# Patient Record
Sex: Female | Born: 1935 | Race: White | Hispanic: No | Marital: Married | Smoking: Current every day smoker
Health system: Southern US, Community
[De-identification: ages and names within clinical notes are randomized; demographics above are authoritative.]

## PROBLEM LIST (undated history)

## (undated) DIAGNOSIS — M899 Disorder of bone, unspecified: Secondary | ICD-10-CM

## (undated) DIAGNOSIS — E785 Hyperlipidemia, unspecified: Secondary | ICD-10-CM

## (undated) DIAGNOSIS — N9489 Other specified conditions associated with female genital organs and menstrual cycle: Secondary | ICD-10-CM

## (undated) DIAGNOSIS — M949 Disorder of cartilage, unspecified: Secondary | ICD-10-CM

## (undated) DIAGNOSIS — F411 Generalized anxiety disorder: Secondary | ICD-10-CM

## (undated) DIAGNOSIS — Z0000 Encounter for general adult medical examination without abnormal findings: Secondary | ICD-10-CM

## (undated) DIAGNOSIS — I10 Essential (primary) hypertension: Secondary | ICD-10-CM

## (undated) DIAGNOSIS — M545 Low back pain: Secondary | ICD-10-CM

## (undated) DIAGNOSIS — M79609 Pain in unspecified limb: Secondary | ICD-10-CM

## (undated) DIAGNOSIS — M7660 Achilles tendinitis, unspecified leg: Secondary | ICD-10-CM

## (undated) DIAGNOSIS — R21 Rash and other nonspecific skin eruption: Secondary | ICD-10-CM

## (undated) DIAGNOSIS — R05 Cough: Secondary | ICD-10-CM

## (undated) DIAGNOSIS — IMO0002 Reserved for concepts with insufficient information to code with codable children: Secondary | ICD-10-CM

## (undated) DIAGNOSIS — J309 Allergic rhinitis, unspecified: Secondary | ICD-10-CM

## (undated) DIAGNOSIS — R7302 Impaired glucose tolerance (oral): Secondary | ICD-10-CM

## (undated) DIAGNOSIS — F329 Major depressive disorder, single episode, unspecified: Secondary | ICD-10-CM

## (undated) HISTORY — DX: Major depressive disorder, single episode, unspecified: F32.9

## (undated) HISTORY — PX: RECTOCELE REPAIR: SHX761

## (undated) HISTORY — PX: CHOLECYSTECTOMY: SHX55

## (undated) HISTORY — DX: Achilles tendinitis, unspecified leg: M76.60

## (undated) HISTORY — DX: Generalized anxiety disorder: F41.1

## (undated) HISTORY — DX: Disorder of cartilage, unspecified: M94.9

## (undated) HISTORY — DX: Reserved for concepts with insufficient information to code with codable children: IMO0002

## (undated) HISTORY — DX: Allergic rhinitis, unspecified: J30.9

## (undated) HISTORY — DX: Disorder of bone, unspecified: M89.9

## (undated) HISTORY — DX: Essential (primary) hypertension: I10

## (undated) HISTORY — DX: Cough: R05

## (undated) HISTORY — PX: ABDOMINAL HYSTERECTOMY: SHX81

## (undated) HISTORY — DX: Other specified conditions associated with female genital organs and menstrual cycle: N94.89

## (undated) HISTORY — DX: Impaired glucose tolerance (oral): R73.02

## (undated) HISTORY — PX: OTHER SURGICAL HISTORY: SHX169

## (undated) HISTORY — DX: Encounter for general adult medical examination without abnormal findings: Z00.00

## (undated) HISTORY — PX: BLADDER SUSPENSION: SHX72

## (undated) HISTORY — DX: Hyperlipidemia, unspecified: E78.5

## (undated) HISTORY — DX: Pain in unspecified limb: M79.609

## (undated) HISTORY — DX: Rash and other nonspecific skin eruption: R21

## (undated) HISTORY — DX: Low back pain: M54.5

---

## 2004-01-04 ENCOUNTER — Ambulatory Visit: Payer: Self-pay | Admitting: Internal Medicine

## 2004-01-07 ENCOUNTER — Ambulatory Visit: Payer: Self-pay | Admitting: Internal Medicine

## 2004-02-21 ENCOUNTER — Ambulatory Visit: Payer: Self-pay | Admitting: Internal Medicine

## 2004-05-22 ENCOUNTER — Ambulatory Visit: Payer: Self-pay | Admitting: Internal Medicine

## 2005-08-25 ENCOUNTER — Ambulatory Visit: Payer: Self-pay | Admitting: Internal Medicine

## 2006-03-03 ENCOUNTER — Ambulatory Visit: Payer: Self-pay | Admitting: Internal Medicine

## 2006-09-29 ENCOUNTER — Ambulatory Visit: Payer: Self-pay | Admitting: Internal Medicine

## 2006-10-04 ENCOUNTER — Ambulatory Visit: Payer: Self-pay | Admitting: Family Medicine

## 2006-10-05 ENCOUNTER — Ambulatory Visit: Payer: Self-pay | Admitting: Internal Medicine

## 2006-10-05 LAB — CONVERTED CEMR LAB
ALT: 22 units/L (ref 0–35)
AST: 21 units/L (ref 0–37)
Albumin: 4.3 g/dL (ref 3.5–5.2)
Alkaline Phosphatase: 57 units/L (ref 39–117)
BUN: 13 mg/dL (ref 6–23)
Basophils Absolute: 0.1 10*3/uL (ref 0.0–0.1)
Basophils Relative: 0.7 % (ref 0.0–1.0)
Bilirubin Urine: NEGATIVE
Bilirubin, Direct: 0.1 mg/dL (ref 0.0–0.3)
CO2: 27 meq/L (ref 19–32)
Calcium: 9.6 mg/dL (ref 8.4–10.5)
Chloride: 108 meq/L (ref 96–112)
Cholesterol: 157 mg/dL (ref 0–200)
Creatinine, Ser: 0.7 mg/dL (ref 0.4–1.2)
Eosinophils Absolute: 0.1 10*3/uL (ref 0.0–0.6)
Eosinophils Relative: 1.3 % (ref 0.0–5.0)
GFR calc Af Amer: 105 mL/min
GFR calc non Af Amer: 87 mL/min
Glucose, Bld: 118 mg/dL — ABNORMAL HIGH (ref 70–99)
HCT: 43 % (ref 36.0–46.0)
HDL: 48.1 mg/dL (ref >39.0)
Hemoglobin: 14.8 g/dL (ref 12.0–15.0)
Hgb A1c MFr Bld: 6 % (ref 4.6–6.0)
Ketones, ur: NEGATIVE mg/dL
LDL Cholesterol: 93 mg/dL (ref 0–99)
Leukocytes, UA: NEGATIVE
Lymphocytes Relative: 36.1 % (ref 12.0–46.0)
MCHC: 34.5 g/dL (ref 30.0–36.0)
MCV: 90.5 fL (ref 78.0–100.0)
Monocytes Absolute: 0.5 10*3/uL (ref 0.2–0.7)
Monocytes Relative: 5.9 % (ref 3.0–11.0)
Neutro Abs: 4.6 10*3/uL (ref 1.4–7.7)
Neutrophils Relative %: 56 % (ref 43.0–77.0)
Nitrite: NEGATIVE
Platelets: 283 10*3/uL (ref 150–400)
Potassium: 4.2 meq/L (ref 3.5–5.1)
RBC: 4.75 M/uL (ref 3.87–5.11)
RDW: 12.8 % (ref 11.5–14.6)
Sodium: 143 meq/L (ref 135–145)
Specific Gravity, Urine: 1.02 (ref 1.000–1.03)
TSH: 2.24 microintl units/mL (ref 0.35–5.50)
Total Bilirubin: 0.8 mg/dL (ref 0.3–1.2)
Total CHOL/HDL Ratio: 3.3
Total Protein, Urine: NEGATIVE mg/dL
Total Protein: 7.2 g/dL (ref 6.0–8.3)
Triglycerides: 80 mg/dL (ref 0–149)
Urine Glucose: NEGATIVE mg/dL
Urobilinogen, UA: 0.2 (ref 0.0–1.0)
VLDL: 16 mg/dL (ref 0–40)
WBC: 8.3 10*3/uL (ref 4.5–10.5)
pH: 6 (ref 5.0–8.0)

## 2006-10-15 DIAGNOSIS — F411 Generalized anxiety disorder: Secondary | ICD-10-CM

## 2006-10-15 DIAGNOSIS — IMO0002 Reserved for concepts with insufficient information to code with codable children: Secondary | ICD-10-CM | POA: Insufficient documentation

## 2006-10-15 DIAGNOSIS — F329 Major depressive disorder, single episode, unspecified: Secondary | ICD-10-CM

## 2006-10-15 DIAGNOSIS — M545 Low back pain, unspecified: Secondary | ICD-10-CM

## 2006-10-15 DIAGNOSIS — I10 Essential (primary) hypertension: Secondary | ICD-10-CM

## 2006-10-15 HISTORY — DX: Generalized anxiety disorder: F41.1

## 2006-10-15 HISTORY — DX: Low back pain, unspecified: M54.50

## 2006-10-15 HISTORY — DX: Essential (primary) hypertension: I10

## 2006-10-15 HISTORY — DX: Major depressive disorder, single episode, unspecified: F32.9

## 2006-10-15 HISTORY — DX: Reserved for concepts with insufficient information to code with codable children: IMO0002

## 2006-10-18 ENCOUNTER — Ambulatory Visit: Payer: Self-pay | Admitting: Internal Medicine

## 2007-04-08 ENCOUNTER — Ambulatory Visit: Payer: Self-pay | Admitting: Internal Medicine

## 2007-04-08 DIAGNOSIS — E785 Hyperlipidemia, unspecified: Secondary | ICD-10-CM

## 2007-04-08 DIAGNOSIS — J309 Allergic rhinitis, unspecified: Secondary | ICD-10-CM | POA: Insufficient documentation

## 2007-04-08 DIAGNOSIS — M899 Disorder of bone, unspecified: Secondary | ICD-10-CM

## 2007-04-08 DIAGNOSIS — M949 Disorder of cartilage, unspecified: Secondary | ICD-10-CM

## 2007-04-08 HISTORY — DX: Hyperlipidemia, unspecified: E78.5

## 2007-04-08 HISTORY — DX: Allergic rhinitis, unspecified: J30.9

## 2007-04-08 HISTORY — DX: Disorder of bone, unspecified: M89.9

## 2007-05-04 ENCOUNTER — Ambulatory Visit: Payer: Self-pay | Admitting: Internal Medicine

## 2007-08-11 ENCOUNTER — Ambulatory Visit: Payer: Self-pay | Admitting: Internal Medicine

## 2007-08-11 DIAGNOSIS — R059 Cough, unspecified: Secondary | ICD-10-CM

## 2007-08-11 DIAGNOSIS — R05 Cough: Secondary | ICD-10-CM

## 2007-08-11 HISTORY — DX: Cough, unspecified: R05.9

## 2007-12-12 ENCOUNTER — Ambulatory Visit: Payer: Self-pay | Admitting: Internal Medicine

## 2007-12-12 LAB — CONVERTED CEMR LAB
ALT: 21 units/L (ref 0–35)
AST: 22 units/L (ref 0–37)
Albumin: 4.4 g/dL (ref 3.5–5.2)
Alkaline Phosphatase: 52 units/L (ref 39–117)
BUN: 13 mg/dL (ref 6–23)
Basophils Absolute: 0.1 10*3/uL (ref 0.0–0.1)
Basophils Relative: 0.7 % (ref 0.0–3.0)
Bilirubin Urine: NEGATIVE
Bilirubin, Direct: 0.2 mg/dL (ref 0.0–0.3)
CO2: 29 meq/L (ref 19–32)
Calcium: 9.6 mg/dL (ref 8.4–10.5)
Chloride: 106 meq/L (ref 96–112)
Cholesterol: 169 mg/dL (ref 0–200)
Creatinine, Ser: 0.8 mg/dL (ref 0.4–1.2)
Crystals: NEGATIVE
Eosinophils Absolute: 0.2 10*3/uL (ref 0.0–0.7)
Eosinophils Relative: 2.2 % (ref 0.0–5.0)
GFR calc Af Amer: 90 mL/min
GFR calc non Af Amer: 74 mL/min
Glucose, Bld: 119 mg/dL — ABNORMAL HIGH (ref 70–99)
HCT: 42.7 % (ref 36.0–46.0)
HDL: 42.6 mg/dL (ref >39.0)
Hemoglobin: 15 g/dL (ref 12.0–15.0)
Hgb A1c MFr Bld: 5.9 % (ref 4.6–6.0)
Ketones, ur: NEGATIVE mg/dL
LDL Cholesterol: 101 mg/dL — ABNORMAL HIGH (ref 0–99)
Lymphocytes Relative: 28.3 % (ref 12.0–46.0)
MCHC: 35 g/dL (ref 30.0–36.0)
MCV: 91.4 fL (ref 78.0–100.0)
Monocytes Absolute: 0.4 10*3/uL (ref 0.1–1.0)
Monocytes Relative: 4.8 % (ref 3.0–12.0)
Neutro Abs: 4.7 10*3/uL (ref 1.4–7.7)
Neutrophils Relative %: 64 % (ref 43.0–77.0)
Nitrite: NEGATIVE
Platelets: 218 10*3/uL (ref 150–400)
Potassium: 3.7 meq/L (ref 3.5–5.1)
RBC: 4.68 M/uL (ref 3.87–5.11)
RDW: 12.7 % (ref 11.5–14.6)
Sodium: 143 meq/L (ref 135–145)
Specific Gravity, Urine: 1.015 (ref 1.000–1.03)
TSH: 1.96 microintl units/mL (ref 0.35–5.50)
Total Bilirubin: 0.8 mg/dL (ref 0.3–1.2)
Total CHOL/HDL Ratio: 4
Total Protein, Urine: NEGATIVE mg/dL
Total Protein: 7.6 g/dL (ref 6.0–8.3)
Triglycerides: 128 mg/dL (ref 0–149)
Urine Glucose: NEGATIVE mg/dL
Urobilinogen, UA: 0.2 (ref 0.0–1.0)
VLDL: 26 mg/dL (ref 0–40)
WBC: 7.6 10*3/uL (ref 4.5–10.5)
pH: 6 (ref 5.0–8.0)

## 2007-12-16 ENCOUNTER — Ambulatory Visit: Payer: Self-pay | Admitting: Internal Medicine

## 2007-12-16 DIAGNOSIS — E739 Lactose intolerance, unspecified: Secondary | ICD-10-CM

## 2008-06-06 ENCOUNTER — Ambulatory Visit: Payer: Self-pay | Admitting: Internal Medicine

## 2008-06-06 DIAGNOSIS — M7660 Achilles tendinitis, unspecified leg: Secondary | ICD-10-CM

## 2008-06-06 DIAGNOSIS — M79609 Pain in unspecified limb: Secondary | ICD-10-CM

## 2008-06-06 HISTORY — DX: Pain in unspecified limb: M79.609

## 2008-06-06 HISTORY — DX: Achilles tendinitis, unspecified leg: M76.60

## 2009-01-04 ENCOUNTER — Ambulatory Visit: Payer: Self-pay | Admitting: Internal Medicine

## 2009-01-04 LAB — CONVERTED CEMR LAB: Hgb A1c MFr Bld: 5.9 % (ref 4.6–6.5)

## 2009-01-07 ENCOUNTER — Ambulatory Visit: Payer: Self-pay | Admitting: Internal Medicine

## 2009-01-07 DIAGNOSIS — R21 Rash and other nonspecific skin eruption: Secondary | ICD-10-CM

## 2009-01-07 HISTORY — DX: Rash and other nonspecific skin eruption: R21

## 2009-01-07 LAB — CONVERTED CEMR LAB
ALT: 20 units/L (ref 0–35)
AST: 24 units/L (ref 0–37)
Albumin: 4.5 g/dL (ref 3.5–5.2)
Alkaline Phosphatase: 52 units/L (ref 39–117)
BUN: 12 mg/dL (ref 6–23)
Basophils Absolute: 0 10*3/uL (ref 0.0–0.1)
Basophils Relative: 0.6 % (ref 0.0–3.0)
Bilirubin Urine: NEGATIVE
Bilirubin, Direct: 0.1 mg/dL (ref 0.0–0.3)
CO2: 26 meq/L (ref 19–32)
Calcium: 9.4 mg/dL (ref 8.4–10.5)
Chloride: 106 meq/L (ref 96–112)
Cholesterol: 181 mg/dL (ref 0–200)
Creatinine, Ser: 0.9 mg/dL (ref 0.4–1.2)
Eosinophils Absolute: 0.1 10*3/uL (ref 0.0–0.7)
Eosinophils Relative: 0.9 % (ref 0.0–5.0)
GFR calc non Af Amer: 64.65 mL/min (ref >60)
Glucose, Bld: 117 mg/dL — ABNORMAL HIGH (ref 70–99)
HCT: 43.1 % (ref 36.0–46.0)
HDL: 45.2 mg/dL (ref >39.00)
Hemoglobin: 14.5 g/dL (ref 12.0–15.0)
Ketones, ur: NEGATIVE mg/dL
LDL Cholesterol: 112 mg/dL — ABNORMAL HIGH (ref 0–99)
Lymphocytes Relative: 26.8 % (ref 12.0–46.0)
Lymphs Abs: 2 10*3/uL (ref 0.7–4.0)
MCHC: 33.7 g/dL (ref 30.0–36.0)
MCV: 93.1 fL (ref 78.0–100.0)
Monocytes Absolute: 0.3 10*3/uL (ref 0.1–1.0)
Monocytes Relative: 3.9 % (ref 3.0–12.0)
Neutro Abs: 4.9 10*3/uL (ref 1.4–7.7)
Neutrophils Relative %: 67.8 % (ref 43.0–77.0)
Nitrite: NEGATIVE
Platelets: 248 10*3/uL (ref 150.0–400.0)
Potassium: 3.9 meq/L (ref 3.5–5.1)
RBC: 4.63 M/uL (ref 3.87–5.11)
RDW: 12.7 % (ref 11.5–14.6)
Sodium: 142 meq/L (ref 135–145)
Specific Gravity, Urine: 1.02 (ref 1.000–1.030)
TSH: 1.68 microintl units/mL (ref 0.35–5.50)
Total Bilirubin: 0.9 mg/dL (ref 0.3–1.2)
Total CHOL/HDL Ratio: 4
Total Protein, Urine: NEGATIVE mg/dL
Total Protein: 7.7 g/dL (ref 6.0–8.3)
Triglycerides: 118 mg/dL (ref 0.0–149.0)
Urine Glucose: NEGATIVE mg/dL
Urobilinogen, UA: 0.2 (ref 0.0–1.0)
VLDL: 23.6 mg/dL (ref 0.0–40.0)
WBC: 7.3 10*3/uL (ref 4.5–10.5)
pH: 5.5 (ref 5.0–8.0)

## 2009-01-10 ENCOUNTER — Telehealth: Payer: Self-pay | Admitting: Internal Medicine

## 2009-02-21 ENCOUNTER — Ambulatory Visit: Payer: Self-pay | Admitting: Internal Medicine

## 2009-07-06 ENCOUNTER — Ambulatory Visit: Payer: Self-pay | Admitting: Internal Medicine

## 2009-11-04 ENCOUNTER — Telehealth (INDEPENDENT_AMBULATORY_CARE_PROVIDER_SITE_OTHER): Payer: Self-pay | Admitting: *Deleted

## 2009-11-08 ENCOUNTER — Ambulatory Visit: Payer: Self-pay | Admitting: Internal Medicine

## 2009-11-09 LAB — CONVERTED CEMR LAB
ALT: 16 units/L (ref 0–35)
AST: 20 units/L (ref 0–37)
Albumin: 4.6 g/dL (ref 3.5–5.2)
Alkaline Phosphatase: 59 units/L (ref 39–117)
BUN: 12 mg/dL (ref 6–23)
Basophils Absolute: 0 10*3/uL (ref 0.0–0.1)
Basophils Relative: 0.3 % (ref 0.0–3.0)
Bilirubin Urine: NEGATIVE
Bilirubin, Direct: 0.1 mg/dL (ref 0.0–0.3)
CO2: 29 meq/L (ref 19–32)
Calcium: 9.6 mg/dL (ref 8.4–10.5)
Chloride: 105 meq/L (ref 96–112)
Cholesterol: 209 mg/dL — ABNORMAL HIGH (ref 0–200)
Creatinine, Ser: 0.8 mg/dL (ref 0.4–1.2)
Direct LDL: 150.9 mg/dL
Eosinophils Absolute: 0.1 10*3/uL (ref 0.0–0.7)
Eosinophils Relative: 1 % (ref 0.0–5.0)
GFR calc non Af Amer: 74.98 mL/min (ref >60)
Glucose, Bld: 95 mg/dL (ref 70–99)
HCT: 43.3 % (ref 36.0–46.0)
HDL: 41.7 mg/dL (ref >39.00)
Hemoglobin: 14.7 g/dL (ref 12.0–15.0)
Ketones, ur: NEGATIVE mg/dL
Leukocytes, UA: NEGATIVE
Lymphocytes Relative: 26.2 % (ref 12.0–46.0)
Lymphs Abs: 2.3 10*3/uL (ref 0.7–4.0)
MCHC: 34 g/dL (ref 30.0–36.0)
MCV: 92.9 fL (ref 78.0–100.0)
Monocytes Absolute: 0.4 10*3/uL (ref 0.1–1.0)
Monocytes Relative: 4.6 % (ref 3.0–12.0)
Neutro Abs: 6 10*3/uL (ref 1.4–7.7)
Neutrophils Relative %: 67.9 % (ref 43.0–77.0)
Nitrite: NEGATIVE
Platelets: 237 10*3/uL (ref 150.0–400.0)
Potassium: 4.2 meq/L (ref 3.5–5.1)
RBC: 4.66 M/uL (ref 3.87–5.11)
RDW: 14 % (ref 11.5–14.6)
Sodium: 142 meq/L (ref 135–145)
Specific Gravity, Urine: 1.025 (ref 1.000–1.030)
TSH: 1.5 microintl units/mL (ref 0.35–5.50)
Total Bilirubin: 0.8 mg/dL (ref 0.3–1.2)
Total CHOL/HDL Ratio: 5
Total Protein, Urine: NEGATIVE mg/dL
Total Protein: 7.1 g/dL (ref 6.0–8.3)
Triglycerides: 189 mg/dL — ABNORMAL HIGH (ref 0.0–149.0)
Urine Glucose: NEGATIVE mg/dL
Urobilinogen, UA: 0.2 (ref 0.0–1.0)
VLDL: 37.8 mg/dL (ref 0.0–40.0)
WBC: 8.9 10*3/uL (ref 4.5–10.5)
pH: 5.5 (ref 5.0–8.0)

## 2009-11-14 ENCOUNTER — Encounter: Payer: Self-pay | Admitting: Internal Medicine

## 2009-11-14 ENCOUNTER — Ambulatory Visit: Payer: Self-pay | Admitting: Internal Medicine

## 2009-11-14 LAB — CONVERTED CEMR LAB
Cholesterol, target level: 200 mg/dL
HDL goal, serum: 40 mg/dL
LDL Goal: 130 mg/dL

## 2009-11-24 ENCOUNTER — Encounter: Payer: Self-pay | Admitting: Internal Medicine

## 2009-11-25 ENCOUNTER — Ambulatory Visit: Payer: Self-pay | Admitting: Internal Medicine

## 2009-11-25 DIAGNOSIS — N9489 Other specified conditions associated with female genital organs and menstrual cycle: Secondary | ICD-10-CM

## 2009-11-25 HISTORY — DX: Other specified conditions associated with female genital organs and menstrual cycle: N94.89

## 2009-11-27 ENCOUNTER — Encounter: Payer: Self-pay | Admitting: Internal Medicine

## 2009-12-27 ENCOUNTER — Telehealth: Payer: Self-pay | Admitting: Internal Medicine

## 2010-02-26 LAB — BASIC METABOLIC PANEL
BUN: 12 mg/dL (ref 6–23)
CO2: 29 mEq/L (ref 19–32)
Calcium: 9.4 mg/dL (ref 8.4–10.5)
Chloride: 103 mEq/L (ref 96–112)
Creatinine, Ser: 0.73 mg/dL (ref 0.4–1.2)
GFR calc Af Amer: >60 mL/min (ref >60)
GFR calc non Af Amer: >60 mL/min (ref >60)
Glucose, Bld: 101 mg/dL — ABNORMAL HIGH (ref 70–99)
Potassium: 3.4 mEq/L — ABNORMAL LOW (ref 3.5–5.1)
Sodium: 140 mEq/L (ref 135–145)

## 2010-02-26 LAB — CBC
HCT: 44.2 % (ref 36.0–46.0)
Hemoglobin: 15.1 g/dL — ABNORMAL HIGH (ref 12.0–15.0)
MCH: 30.9 pg (ref 26.0–34.0)
MCHC: 34.2 g/dL (ref 30.0–36.0)
MCV: 90.6 fL (ref 78.0–100.0)
Platelets: 254 10*3/uL (ref 150–400)
RBC: 4.88 MIL/uL (ref 3.87–5.11)
RDW: 13.6 % (ref 11.5–15.5)
WBC: 10 10*3/uL (ref 4.0–10.5)

## 2010-02-26 LAB — APTT: aPTT: 32 seconds (ref 24–37)

## 2010-02-26 LAB — PROTIME-INR
INR: 0.94 (ref 0.00–1.49)
Prothrombin Time: 12.8 seconds (ref 11.6–15.2)

## 2010-03-04 ENCOUNTER — Inpatient Hospital Stay (HOSPITAL_COMMUNITY)
Admission: RE | Admit: 2010-03-04 | Discharge: 2010-03-08 | Payer: Self-pay | Source: Home / Self Care | Attending: Obstetrics & Gynecology | Admitting: Obstetrics & Gynecology

## 2010-03-10 LAB — URINALYSIS, ROUTINE W REFLEX MICROSCOPIC
Ketones, ur: 15 mg/dL — AB
Leukocytes, UA: NEGATIVE
Nitrite: POSITIVE — AB
Protein, ur: NEGATIVE mg/dL
Specific Gravity, Urine: >1.03 — ABNORMAL HIGH (ref 1.005–1.030)
Urine Glucose, Fasting: NEGATIVE mg/dL
Urobilinogen, UA: 0.2 mg/dL (ref 0.0–1.0)
pH: 5 (ref 5.0–8.0)

## 2010-03-10 LAB — DIFFERENTIAL
Basophils Absolute: 0 10*3/uL (ref 0.0–0.1)
Basophils Relative: 0 % (ref 0–1)
Eosinophils Absolute: 0 10*3/uL (ref 0.0–0.7)
Eosinophils Relative: 0 % (ref 0–5)
Lymphocytes Relative: 4 % — ABNORMAL LOW (ref 12–46)
Lymphs Abs: 0.8 10*3/uL (ref 0.7–4.0)
Monocytes Absolute: 0.6 10*3/uL (ref 0.1–1.0)
Monocytes Relative: 4 % (ref 3–12)
Neutro Abs: 16.4 10*3/uL — ABNORMAL HIGH (ref 1.7–7.7)
Neutrophils Relative %: 92 % — ABNORMAL HIGH (ref 43–77)

## 2010-03-10 LAB — CBC
HCT: 37.7 % (ref 36.0–46.0)
HCT: 40.7 % (ref 36.0–46.0)
HCT: 37.5 % (ref 36.0–46.0)
Hemoglobin: 12.9 g/dL (ref 12.0–15.0)
Hemoglobin: 13.6 g/dL (ref 12.0–15.0)
Hemoglobin: 12.6 g/dL (ref 12.0–15.0)
MCH: 30.4 pg (ref 26.0–34.0)
MCH: 29.4 pg (ref 26.0–34.0)
MCH: 29.8 pg (ref 26.0–34.0)
MCHC: 34.2 g/dL (ref 30.0–36.0)
MCHC: 33.4 g/dL (ref 30.0–36.0)
MCHC: 33.6 g/dL (ref 30.0–36.0)
MCV: 88.9 fL (ref 78.0–100.0)
MCV: 87.9 fL (ref 78.0–100.0)
MCV: 88.7 fL (ref 78.0–100.0)
Platelets: 230 10*3/uL (ref 150–400)
Platelets: 217 10*3/uL (ref 150–400)
Platelets: 207 10*3/uL (ref 150–400)
RBC: 4.24 MIL/uL (ref 3.87–5.11)
RBC: 4.63 MIL/uL (ref 3.87–5.11)
RBC: 4.23 MIL/uL (ref 3.87–5.11)
RDW: 13.2 % (ref 11.5–15.5)
RDW: 13 % (ref 11.5–15.5)
RDW: 13.1 % (ref 11.5–15.5)
WBC: 12.1 10*3/uL — ABNORMAL HIGH (ref 4.0–10.5)
WBC: 17.8 10*3/uL — ABNORMAL HIGH (ref 4.0–10.5)
WBC: 14.1 10*3/uL — ABNORMAL HIGH (ref 4.0–10.5)

## 2010-03-10 LAB — URINE MICROSCOPIC-ADD ON

## 2010-03-14 IMAGING — CR DG CHEST 2V
2 series · 2 of 2 positions shown · non-contrast
Comparison: 08/11/2007

CLINICAL DATA: Acute bronchitis.

CHEST - 2 VIEW

[view not recorded (1 of 2)]
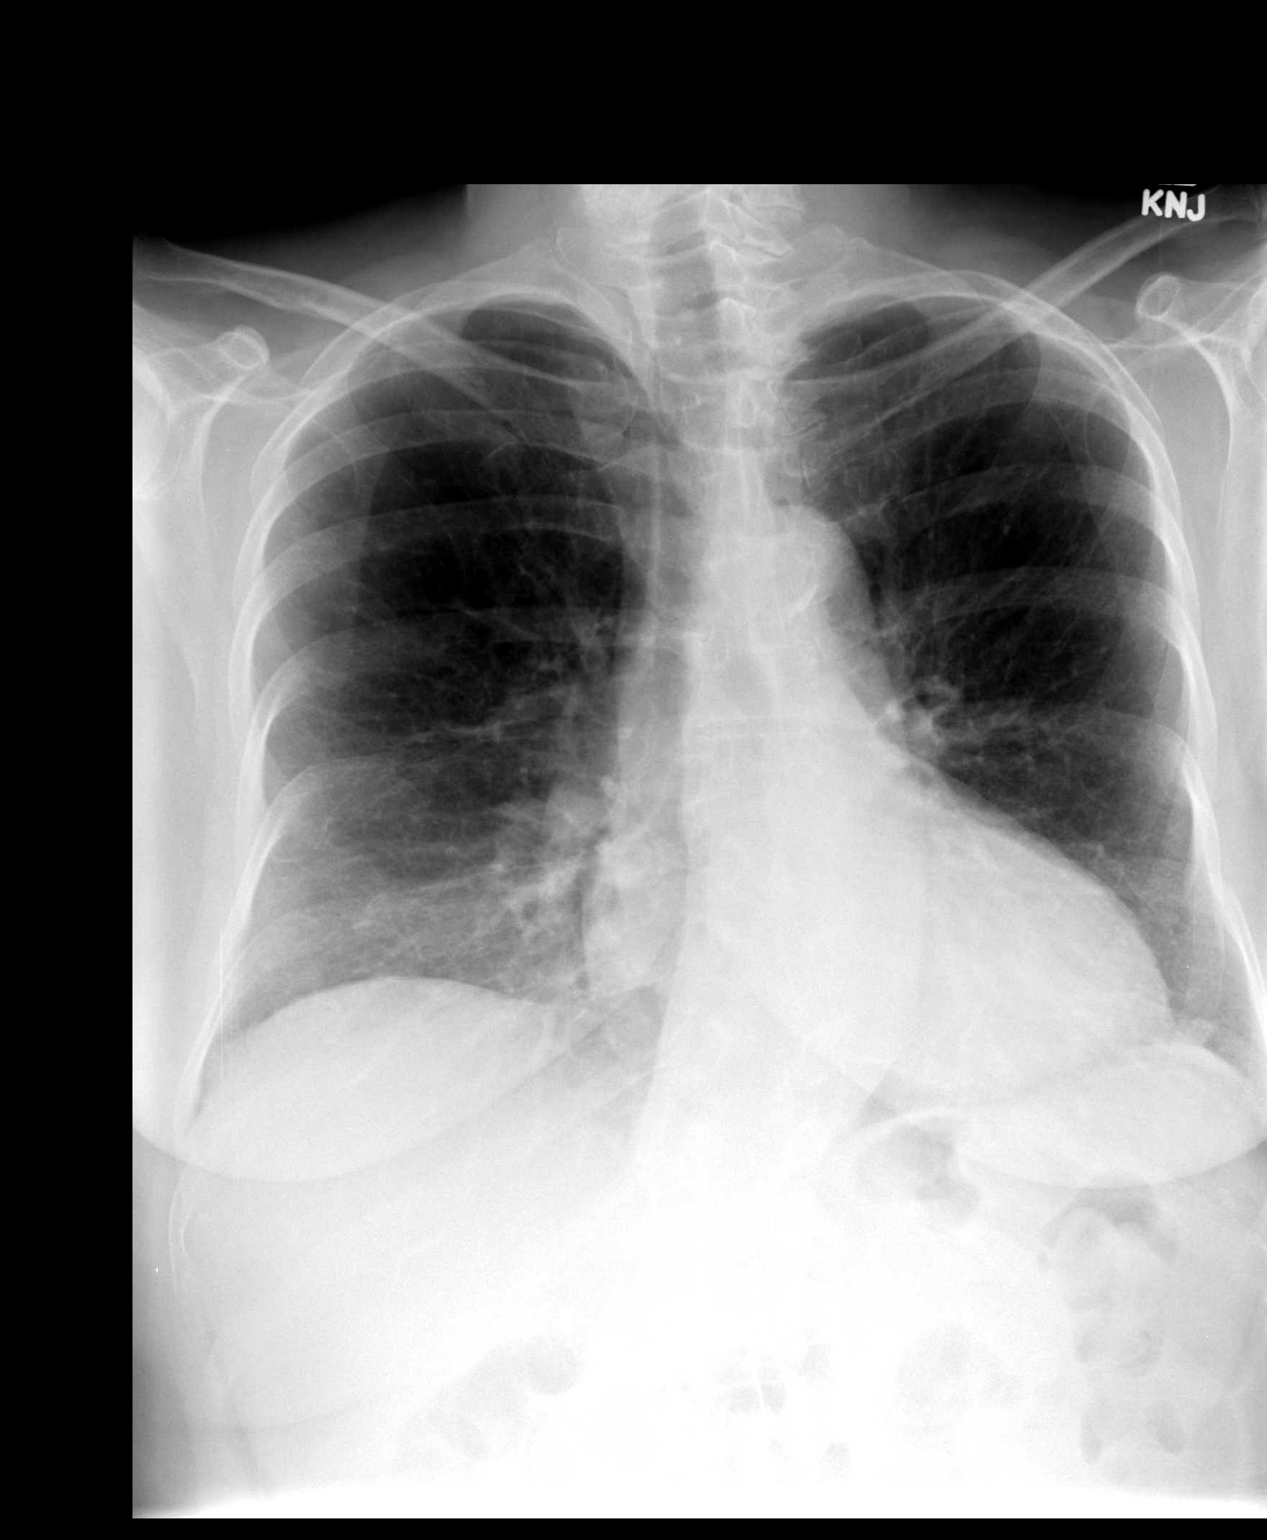

[view not recorded (2 of 2)]
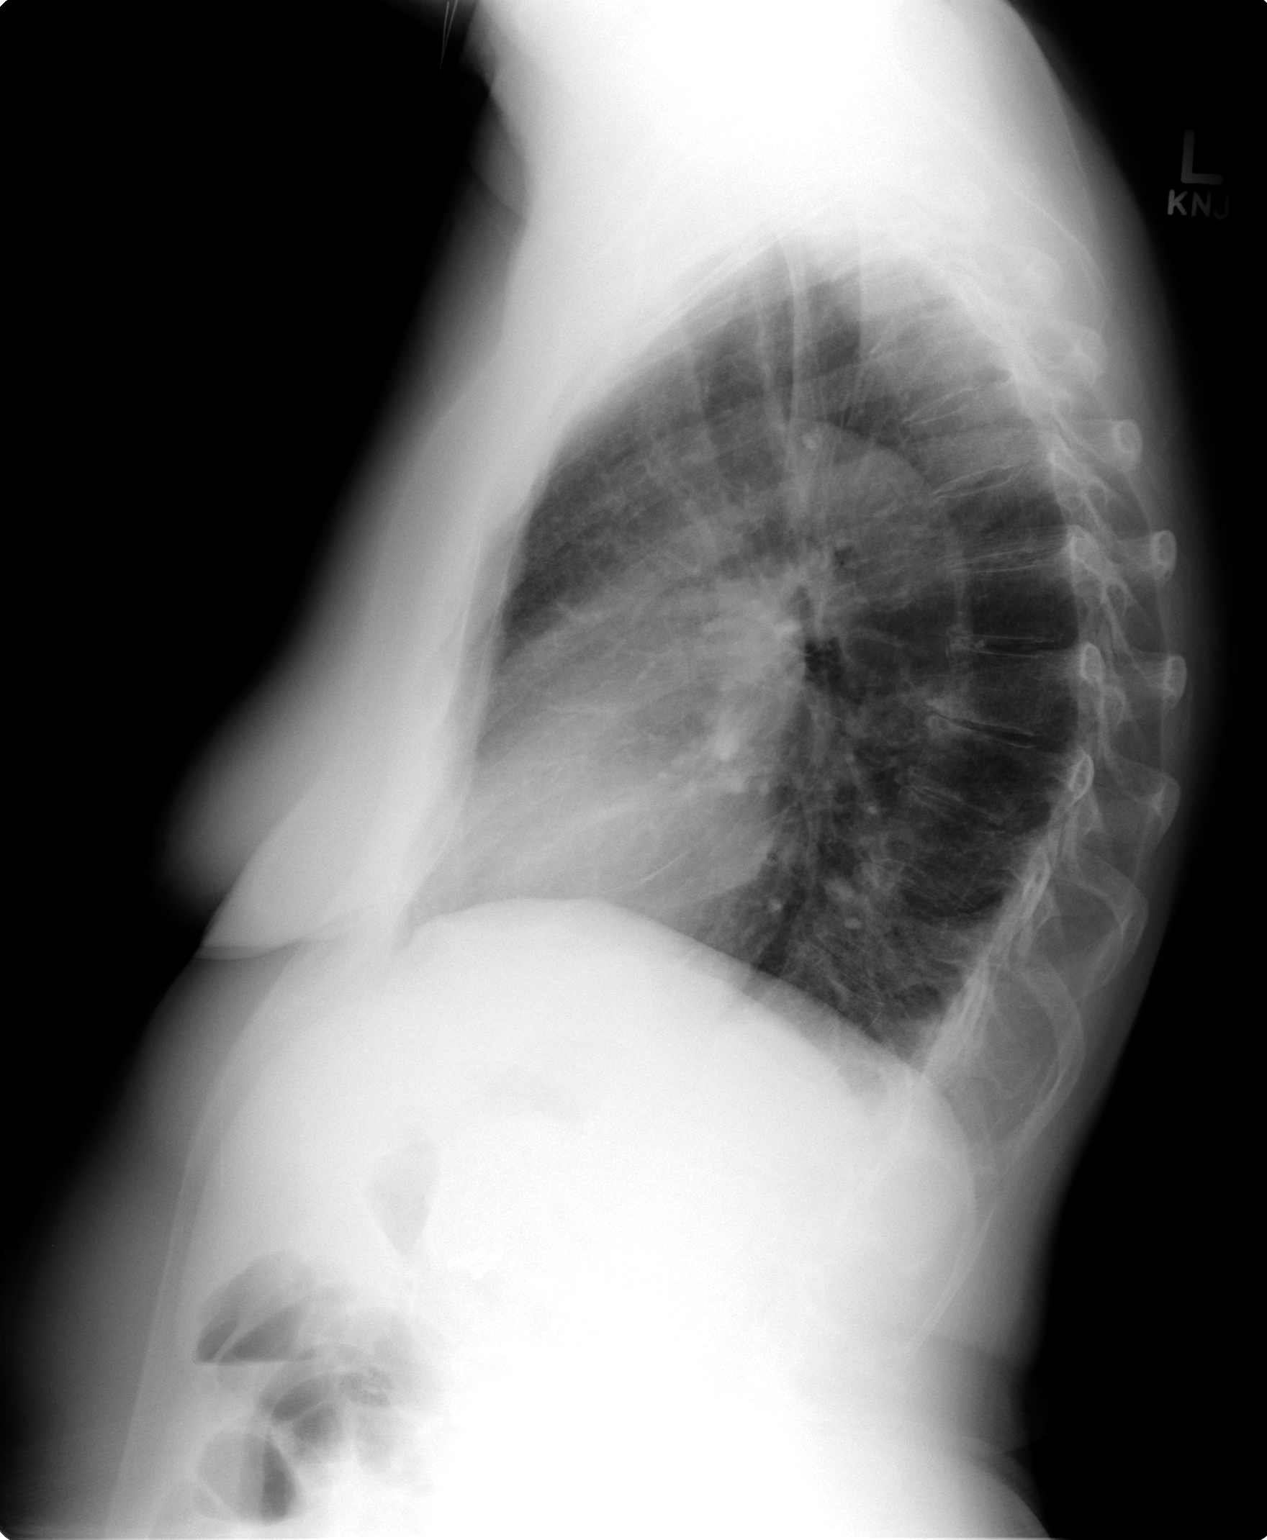

[2 of 2 positions shown; findings below may reference images not displayed]

FINDINGS: Mild pectus excavatum deformity.  This accentuates
borderline cardiomegaly on the frontal view.  Midline trachea.
Calcified transverse aorta. No pleural effusion or pneumothorax.
Clear lungs.

Prior cholecystectomy.
IMPRESSION: 1. No acute cardiopulmonary disease.
2.  Mild pectus excavatum deformity superimposed upon borderline
cardiomegaly.

## 2010-03-20 NOTE — H&P (Addendum)
Brandi Schaefer, Brandi Schaefer NO.:  [id number]  MEDICAL RECORD NO.:  [id number]          PATIENT TYPE:  AMB  LOCATION:  SDC                           FACILITY:  [facility]  PHYSICIAN:  Freddy Finner, M.D.   DATE OF BIRTH:  1930-11-20  DATE OF ADMISSION: DATE OF DISCHARGE:                             HISTORY & PHYSICAL   ADMITTING DIAGNOSES:  Third-degree cystocele, second-degree rectocele, symptomatic.  The patient is a 77 year old white married female, gravida 4, para 4, who was first seen in my office on referral from her regular physician, Dr. Oliver Barre for symptoms of vaginal prolapse.  At that visit, she was confirmed have a third-degree cystocele and second-degree rectocele. This does produce some symptoms of discomfort and pelvic pressure and the patient has now elected to proceed with surgery.  She is now admitted for anterior-posterior vaginal repair, sacrospinous ligament suspension of vagina.  Potential risks of the surgery, including infection, hemorrhage, injury to other organs and other issues, including deep vein thrombosis have been discussed with her, as well as the appropriate time measures as prophylaxis to reduce these issues. She is now prepared to proceed with surgery and is admitted now for that purpose.  REVIEW OF SYSTEMS:  Her current review of systems at the present time is negative.  She has no cardiopulmonary, GI or other GU complaints.  FAMILY HISTORY:  Noncontributory.  PAST MEDICAL HISTORY: 1. The patient is known to have hypertension for which she is     medicated.  Her medication should be listed on the reconciliation     sheet that will be compiled on admission. 2. She also has hypercholesterolemia for which she takes lovastatin. 3. She takes a daily baby aspirin, which she has discontinued for at     least 7 days prior to surgery.  ALLERGIES:  Her known allergies are to CODEINE, SULFA, KEFLEX, NOVOCAINE, EPINEPHRINE AND  SEAFOOD.  PREVIOUS SURGICAL PROCEDURES: 1. Vaginal births on four occasions between 1957-1964. 2. She had her hysterectomy in 1967, which was vaginal. 3. She had gallbladder surgery for gallstones in 1997.  She is a cigarette smoker.  She has never required a blood transfusion.  PHYSICAL EXAMINATION:  HEENT:  Grossly within normal limits. VITAL SIGNS:  Her weight is 125, height 5 feet 7-1/4 inches.  Blood pressure in the office 160/80. GENERAL:  She is alert and oriented in no acute distress. CHEST:  Clear to auscultation. HEART:  Normal sinus rhythm without murmurs, rubs or gallops.  There was an occasional extrasystole, (the patient states recent EKG was normal). ABDOMEN:  Soft and nontender. EXTREMITIES:  Without cyanosis, clubbing or edema. PELVIC:  Findings are remarkable for cystocele, which prolapses through the introitus and a rectocele, which prolapses to the introitus. Bimanual exam reveals no palpable masses.  Rectum and rectovaginal exam confirmed these findings.  ASSESSMENT:  Symptomatic pelvic relaxation.  PLAN:  Anterior-posterior vaginal repair, sacrospinous ligament suspension of vagina, suprapubic cystotomy with a banana catheter.     Freddy Finner, M.D.     WRN/MEDQ  D:  03/04/2010  T:  03/04/2010  Job:  [id number]  Electronically Signed by Lacretia Nicks. Shaleigh Laubscher M.D. on 03/20/2010 08:52:41 AM

## 2010-03-20 NOTE — Discharge Summary (Addendum)
Brandi Schaefer, WIERMAN NO.:  [id number]  MEDICAL RECORD NO.:  [id number]          PATIENT TYPE:  INP  LOCATION:  9305                          FACILITY:  [facility]  PHYSICIAN:  Freddy Finner, M.D.   DATE OF BIRTH:  1930/03/18  DATE OF ADMISSION:  03/04/2010 DATE OF DISCHARGE:  03/08/2010                              DISCHARGE SUMMARY   DISCHARGE DIAGNOSES:  Third-degree cystocele, second-degree rectocele, chronic hypertension and hypercholesterolemia.  OPERATIVE PROCEDURES:  Anterior and posterior vaginal repair, sacrospinous ligament suspension of vagina.  INTRAOPERATIVE COMPLICATIONS:  None.  POSTOPERATIVE COMPLICATIONS:  Urinary tract infection, culture pending, mild postoperative ileus with resolution.  OTHER POSTOPERATIVE COMPLICATIONS:  None.  DISPOSITION:  The patient is in satisfactory and improved condition on the fourth postoperative day.  At this time, she is voiding on her own with voiding volumes of approximately 200 mL and residuals of 50 mL. She is having adequate bowel function.  She is taking a regular diet adequately.  Her condition is considered to be satisfactory.  She is to have progressively increasing physical activity, no vaginal entry, no heavy lifting.  She is to report heavy vaginal bleeding.  She is to report fever.  She is to report urinary retention or to return to the hospital in the event of that problem.  She is to take a regular diet. She is to resume all of her admission medications.  Details of the present illness, past history, family history, review of systems and physical exam are recorded in the admission note.  Her significant physical findings are related to this admission are third- degree rectocele, third-degree cystocele, second-degree rectocele and uterine and vaginal vault descensus.  LABORATORY DATA:  During this admission includes a normal admission prothrombin time and INR normal.  Normal PTT, normal chem  profile and normal CBC.  Postoperative CBC on the first postoperative morning showed a white count of 12.1, hemoglobin of 12.9.  Complete metabolic panel on admission was essentially normal except potassium was 3.4 and glucose 101.  Postoperatively, second and third CBC were obtained.  One on the day prior to discharge, it was found that her white count was 17.1. Urinalysis on that day on a cath specimen showed nitrates and many bacteria.  Repeat WBC on the morning of discharge was 14.1.  HOSPITAL COURSE:  The patient was admitted on the morning of surgery. She was given Unasyn 3 g IV as a preop bolus.  She was placed in PAS hose.  She was brought to the operating room, where the above procedure was accomplished.  Postoperatively, her initial postoperative course was remarkable for profound nausea and vomiting with narcotic pain medication.  She later had a slow return of bowel function and complained of abdominal pain which was thought to be bowel in origin. She responded to Fleet enema and oral laxatives and on the day prior, her discharge had profound diarrhea which was treated with Lomotil.  On the evening of the day prior to discharge, she was started on IV fluids again because of marked consultation of the urine, poor oral intake. With rehydration, she made  significant improvement on the day prior to her discharge for suprapubic catheter was removed and by the time of her discharge, she was voiding adequately with residuals in the 50 mL range.  She was started on Cipro p.o. on the day of discharge and this will be continued at home.  She is to return to the office in 7-10 days for postoperative followup.  She remained afebrile throughout her hospital stay.     Freddy Finner, M.D.     WRN/MEDQ  D:  03/08/2010  T:  03/09/2010  Job:  [id number]  Electronically Signed by Kayela Humphres. Jadyn Barge M.D. on 03/20/2010 08:52:38 AM

## 2010-03-20 NOTE — Op Note (Addendum)
NAMEAIYAH, SCARPELLI NO.:  [id number]  MEDICAL RECORD NO.:  [id number]          PATIENT TYPE:  INP  LOCATION:  9305                          FACILITY:  [facility]  PHYSICIAN:  Freddy Finner, M.D.   DATE OF BIRTH:  19-Sep-1930  DATE OF PROCEDURE:  03/04/2010 DATE OF DISCHARGE:                              OPERATIVE REPORT   PREOPERATIVE DIAGNOSES:  Cystocele with prolapse, rectocele with prolapse to the vaginal opening, vaginal vault descensus.  POSTOPERATIVE DIAGNOSES:  Cystocele with prolapse, rectocele with prolapse to the vaginal opening, vaginal vault descensus.  OPERATIVE PROCEDURES:  Anterior and posterior vaginal repair, sacrospinous ligament suspension of vagina, suprapubic cystotomy with a banana catheter.  SURGEON:  Freddy Finner, MD  ASSISTANT:  __________  ANESTHESIA:  General endotracheal.  ESTIMATED INTRAOPERATIVE BLOOD LOSS:  50 mL.  INTRAOPERATIVE COMPLICATIONS:  None.  DETAILS OF THE PRESENT ILLNESS RECORDED IN THE ADMISSION:  The patient was admitted on the morning of surgery.  She was given a bolus of Unasyn for IV administration preoperatively.  She was placed in PAS hose.  She was brought into the operating room there and placed under adequate general endotracheal anesthesia and placed in dorsal lithotomy position using the Hartly stirrup system.  Hibiclens was used to do the prep because the patient is allergic to [facility].  Sterile drapes were applied.  A posterior weighted vaginal retractor was placed.  Anterior vaginal mucosa was grasped just distal to the apex of the vagina in the midline.  A sharp incision was made with a 15 scalpel.  Edges of the incision were grasped with Allis in progress of extension of the incision was carried out after blunt and sharp dissection to free the mucosa from the bladder and this was extended for a distance of approximately 67 cm and was not extended into the urethrovesical angle because she has  no issues of incontinence.  With careful blunt and sharp dissection, the bladder was dissected free from the mucosa.  Plication sutures of 0 Monocryl were then used to reapproximate the urogenital fascia and support the cystocele.  Segments of mucosa were excised.  The incision was closed with a running 2-0 Monocryl suture.  Attention was then turned posteriorly.  The vaginal mucosa was grasped with a fourchette on either side.  A pyramidal shaped segment of skin was excised from the perineal body with apex inferiorly. Mucosa overlying the rectum was then treated essentially in a identical manner to the anterior dissection.  This freed the rectum from the mucosa.  Using a Capio device, a Dexon suture was attached to the right sacrospinous ligament and attached to the mucosa above the level of the incision. Plication sutures were then used to reapproximate the anorectal tissue. A total of 3 were used and one was used to reapproximate the levators. The mucosal closure was then started with a running 2-0 Monocryl and was closed for a distance of approximately 3-4 cm.  The sac spinous ligament stitch was then tied adequately elevating the vaginal vault into a more anatomic location.  The remaining incision was closed in a fashion similar  to an episiotomy.  The case was so dry, was elected not to place a vaginal pack.  The bladder was filled with saline.  The banana suprapubic catheter was inserted without difficulty and anchored to the skin with silk sutures.  Please note, the bladder was then emptied and the suprapubic device attached to the Foley system.  The patient was then awakened and taken to the recovery room in good condition.     Freddy Finner, M.D.     WRN/MEDQ  D:  03/04/2010  T:  03/05/2010  Job:  [id number]  Electronically Signed by Nylan Nevel. Talor Cheema M.D. on 03/20/2010 08:52:43 AM

## 2010-03-25 NOTE — Miscellaneous (Signed)
Summary: Orders Update  Clinical Lists Changes  Orders: Added new Service order of EKG w/ Interpretation (93000) - Signed 

## 2010-03-25 NOTE — Progress Notes (Signed)
  Phone Note Call from Patient Call back at Home Phone [phone]   Caller: Patient Call For: Corwin Levins MD Summary of Call: Pt requesting labs prior to office visit in September. Please advise what labs are needed prior to visit? Initial call taken by: Verdell Face,  November 04, 2009 10:22 AM  Follow-up for Phone Call        CP[facility] Ua Follow-up by: Margaret Pyle, CMA,  November 04, 2009 10:28 AM

## 2010-03-25 NOTE — Assessment & Plan Note (Signed)
Summary: FOOT PAIN//VGJ   Vital Signs:  Patient profile:   77 year old female Weight:      135 pounds O2 Sat:      94 % on Room air Temp:     98.6 degrees F oral Pulse rate:   100 / minute BP sitting:   130 / 80  (left arm)  Vitals Entered By: Doristine Devoid (Jul 06, 2009 9:59 AM)  O2 Flow:  Room air CC: R foot pain worse at night    History of Present Illness: Started with cramping in left 3rd and 4th toes at times--about 1 month ago would be stiff and then get better then started being awakened with pain at night under those toes would rub it and would eventually improve Better for a few nights then restarted a week ago Pain feels similar to pain from herniated disc   Started taping together 5-6 days ago better for a few days but has restarted again still only at night No restrictions in walking or activity  no swelling  Allergies: 1)  ! Codeine 2)  ! Sulfa 3)  ! Keflex 4)  ! Novocain 5)  ! Epinephrine 6)  ! * Asperatame 7)  ! Biaxin 8)  ! * Pneumovax 9)  ! * Generic Only Lotrel 10/20  Past History:  Past medical, surgical, family and social histories (including risk factors) reviewed for relevance to current acute and chronic problems.  Past Medical History: Reviewed history from 12/16/2007 and no changes required. Herniated Disc Anxiety Depression Hypertension Low back pain skin cancer left arm - melanoma per pt- dr Terri Piedra Allergic rhinitis Osteopenia Hyperlipidemia t-spine DJD glucose intolerance  Past Surgical History: Reviewed history from 06/06/2008 and no changes required. 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 CHF HTN uterine cancer COPD - grandfather stroke DM Family History of Alcoholism/Addiction son with paranoid schizophrenia, CHF grandmother with dementia  Social History: Reviewed  history from 04/08/2007 and no changes required. 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   Msk:  no joint tenderness and no joint swelling.   No tenderness in left ankle, foot or toes Normal ROM   Impression & Recommendations:  Problem # 1:  TOE PAIN (ICD-729.5) Assessment New exam is normal pain sounds nerve related will try pad under toes to give forefoot support stability shoes ?podiatrist if not improving  Complete Medication List: 1)  Lovastatin 40 Mg Tabs (Lovastatin) .Marland Kitchen.. 1 by mouth once daily 2)  Ecotrin Low Strength 81 Mg Tbec (Aspirin) .Marland Kitchen.. 1 by mouth qd 3)  Amlodipine Besy-benazepril Hcl 5-10 Mg Caps (Amlodipine besy-benazepril hcl) .... 2 by mouth once daily 4)  Cetirizine Hcl 10 Mg Tabs (Cetirizine hcl) .Marland Kitchen.. 1 by mouth once daily 5)  Tramadol Hcl 50 Mg Tabs (Tramadol hcl) .Marland Kitchen.. 1 at bedtime as needed for toe pain  Patient Instructions: 1)  Try stability shoes 2)  Put pad under left middle toes then tape during the day 3)  If the pain continues, will need to see podiatrist Prescriptions: TRAMADOL HCL 50 MG TABS (TRAMADOL HCL) 1 at bedtime as needed for toe pain  #30 x 0   Entered and Authorized by:   Cindee Salt MD  Signed by:   Cindee Salt MD on 07/06/2009   Method used:   Electronically to        [facility] [phone][facility] (retail)       [address]       [place], [place]  [address]       Ph: [phone]       Fax: [phone]   RxID:   [id number]

## 2010-03-25 NOTE — Consult Note (Signed)
Summary: [facility]   Imported By: Sherian Rein 12/10/2009 10:13:59  _____________________________________________________________________  External Attachment:    Type:   Image     Comment:   External Document

## 2010-03-25 NOTE — Progress Notes (Signed)
Summary: Rx Change.   Phone Note Call from Patient Call back at Home Phone [phone]   Caller: Patient Call For: Corwin Levins MD Summary of Call: Pt is requesting for Dr. Jonny Ruiz to increase the LID2% to LID5% of KETOPROF. Pt stated that she spoke with Dr. Terri Piedra and he adviced her to have Dr. Jonny Ruiz to increase this med so it would work better. Pt stated that this med has not been working for her. Pt will be out of town and she will be back on 01/01/2010.  Initial call taken by: Livingston Diones,  December 27, 2009 2:11 PM  Follow-up for Phone Call        done hardcopy to LIM side B - dahlia  Follow-up by: Corwin Levins MD,  December 27, 2009 6:07 PM  Additional Follow-up for Phone Call Additional follow up Details #1::        Pt informed, Rx in cabinet for pt pick up Additional Follow-up by: Margaret Pyle, CMA,  December 30, 2009 8:45 AM    New/Updated Medications: * KETOPROF20%/LID5%/GABA5%P use asd at bedtime as needed Prescriptions: KETOPROF20%/LID5%/GABA5%P use asd at bedtime as needed  #60 gm x 1   Entered and Authorized by:   Corwin Levins MD   Signed by:   Corwin Levins MD on 12/27/2009   Method used:   Print then Give to Patient   RxID:   [id number]

## 2010-03-25 NOTE — Assessment & Plan Note (Signed)
Summary: f/u appt/cd   #   Vital Signs:  Patient profile:   77 year old female Height:      60 inches Weight:      125.50 pounds BMI:     24.60 O2 Sat:      92 % on Room air Temp:     98.1 degrees F oral Pulse rate:   79 / minute BP sitting:   128 / 84  (left arm) Cuff size:   regular  Vitals Entered By: Zella Ball Ewing CMA Duncan Dull) (November 14, 2009 3:23 PM)  O2 Flow:  Room air  Preventive Care Screening     decleine dxa for now - maybe next yr; declines mammogram  CC: followup/RE, Lipid Management   CC:  followup/RE and Lipid Management.  History of Present Illness: here for wellness, overall doing well, has ongoing toe pain without erythema or sweling;    only taking the statin 3 times per wk after a letter from [facility] that scared her  going to [facility] twice per wk  lost some wt from last yr intentionally - 13 lbs ;  Pt denies CP, worsening sob, doe, wheezing, orthopnea, pnd, worsening LE edema, palps, dizziness or syncope  Pt denies new neuro symptoms such as headache, facial or extremity weakness  Pt denies polydipsia, polyuria.  Overall good compliance with meds, trying to follow low chol, DM diet, wt stable, little excercise however  No fever, wt loss, night sweats, loss of appetite or other constitutional symptoms   Lipid Management History:      Positive NCEP/ATP III risk factors include female age 46 years old or older, current tobacco user, and hypertension.    Preventive Screening-Counseling & Management      Drug Use:  no.    Problems Prior to Update: 1)  Toe Pain  (ICD-729.5) 2)  Rash-nonvesicular  (ICD-782.1) 3)  Achilles Tendinitis  (ICD-726.71) 4)  Thumb Pain, Right  (ICD-729.5) 5)  Glucose Intolerance  (ICD-271.3) 6)  Preventive Health Care  (ICD-V70.0) 7)  Cough  (ICD-786.2) 8)  Hyperlipidemia  (ICD-272.4) 9)  Osteopenia  (ICD-733.90) 10)  Family History of Alcoholism/addiction  (ICD-V61.41) 11)  Allergic Rhinitis  (ICD-477.9) 12)  Low Back Pain   (ICD-724.2) 13)  Hypertension  (ICD-401.9) 14)  Depression  (ICD-311) 15)  Anxiety  (ICD-300.00) 16)  Herniated Disc  (ICD-722.2)  Medications Prior to Update: 1)  Lovastatin 40 Mg Tabs (Lovastatin) .Marland Kitchen.. 1 By Mouth Once Daily 2)  Ecotrin Low Strength 81 Mg  Tbec (Aspirin) .Marland Kitchen.. 1 By Mouth Qd 3)  Amlodipine Besy-Benazepril Hcl 5-10 Mg Caps (Amlodipine Besy-Benazepril Hcl) .... 2 By Mouth Once Daily 4)  Cetirizine Hcl 10 Mg  Tabs (Cetirizine Hcl) .Marland Kitchen.. 1 By Mouth Once Daily 5)  Tramadol Hcl 50 Mg Tabs (Tramadol Hcl) .Marland Kitchen.. 1 At Bedtime As Needed For Toe Pain  Current Medications (verified): 1)  Lovastatin 40 Mg Tabs (Lovastatin) .Marland Kitchen.. 1 By Mouth Once Daily 2)  Ecotrin Low Strength 81 Mg  Tbec (Aspirin) .Marland Kitchen.. 1 By Mouth Qd 3)  Amlodipine Besy-Benazepril Hcl 5-10 Mg Caps (Amlodipine Besy-Benazepril Hcl) .... 2 By Mouth Once Daily 4)  Cetirizine Hcl 10 Mg  Tabs (Cetirizine Hcl) .Marland Kitchen.. 1 By Mouth Once Daily 5)  Ketoprof20%/lid2%/gaba5%p .... Use Asd At Bedtime As Needed  Allergies (verified): 1)  ! Codeine 2)  ! Sulfa 3)  ! Keflex 4)  ! Novocain 5)  ! Epinephrine 6)  ! * Asperatame 7)  ! Biaxin 8)  ! * Pneumovax  9)  ! 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         all otherwise negative per pt -    Physical Exam  General:  alert and normal appearance.   Head:  normocephalic and atraumatic.   Eyes:  vision grossly intact, pupils equal, and pupils round.   Ears:  R ear normal and L ear normal.   Nose:  no external deformity and no nasal discharge.   Mouth:  no gingival abnormalities and pharynx pink and moist.   Neck:  supple and no masses.   Lungs:  normal respiratory effort and normal breath sounds.   Heart:  normal rate and regular rhythm.   Abdomen:  soft, non-tender, and normal bowel sounds.   Msk:  no joint tenderness and no joint swelling.   No tenderness in left ankle, foot or toes Normal ROM Extremities:  no edema, no erythema  Neurologic:  cranial nerves II-XII intact and strength normal in all extremities.   Skin:  color normal and no rashes.   Psych:  not depressed appearing and slightly anxious.     Impression & Recommendations:  Problem # 1:  Preventive Health Care (ICD-V70.0) Overall doing well, age appropriate education and counseling updated and referral for appropriate preventive services done unless declined, immunizations up to date or declined, diet counseling done if overweight, urged to quit smoking if smokes , most  recent labs reviewed and current ordered if appropriate, ecg reviewed or declined (interpretation per ECG scanned in the EMR if done); information regarding Medicare Prevention requirements given if appropriate; speciality referrals updated as appropriate   Problem # 2:  TOE PAIN (ICD-729.5) per ortho - treat as above, f/u any worsening signs or symptoms   Problem # 3:  HYPERLIPIDEMIA (ICD-272.4)  Her updated medication list for this problem includes:    Lovastatin 40 Mg Tabs (Lovastatin) .Marland Kitchen... 1 by mouth once daily to change back to lovastatin daily  Labs Reviewed: SGOT: 20 (11/08/2009)   SGPT: 16 (11/08/2009)  Lipid Goals: Chol Goal: 200 (11/14/2009)   HDL Goal: 40 (11/14/2009)   LDL Goal: 130 (11/14/2009)   TG Goal: 150 (11/14/2009)  10 Yr Risk Heart Disease: 17 %   HDL:41.70 (11/08/2009), 45.20 (01/04/2009)  LDL:112 (01/04/2009), 101 (12/12/2007)  Chol:209 (11/08/2009), 181 (01/04/2009)  Trig:189.0 (11/08/2009), 118.0 (01/04/2009)  Complete Medication List: 1)  Lovastatin  40 Mg Tabs (Lovastatin) .Marland Kitchen.. 1 by mouth once daily 2)  Ecotrin Low Strength 81 Mg Tbec (Aspirin) .Marland Kitchen.. 1 by mouth qd 3)  Amlodipine Besy-benazepril Hcl 5-10 Mg Caps (Amlodipine besy-benazepril hcl) .... 2 by mouth once daily 4)  Cetirizine Hcl 10 Mg Tabs (Cetirizine hcl) .Marland Kitchen.. 1 by mouth once daily 5)  Ketoprof20%/lid2%/gaba5%p  .... Use asd at bedtime as needed  Lipid Assessment/Plan:      Based on NCEP/ATP III, the patient's risk factor category is "0-1 risk factors".  The patient's lipid goals are as follows: Total cholesterol goal is 200; LDL cholesterol goal is 130; HDL cholesterol goal is 40; Triglyceride goal is 150.     Patient Instructions: 1)  Continue all previous medications as before this visit 2)  Please schedule a follow-up appointment in 1 year or sooner if needed Prescriptions: CETIRIZINE HCL 10 MG  TABS (CETIRIZINE HCL) 1 by mouth once daily  #90 x 3   Entered and Authorized by:   Corwin Levins  MD   Signed by:   Corwin Levins MD on 11/14/2009   Method used:   Print then Give to Patient   RxID:   [id number] AMLODIPINE BESY-BENAZEPRIL HCL 5-10 MG CAPS (AMLODIPINE BESY-BENAZEPRIL HCL) 2 by mouth once daily  #180 x 3   Entered and Authorized by:   Corwin Levins MD   Signed by:   Corwin Levins MD on 11/14/2009   Method used:   Print then Give to Patient   RxID:   [id number] LOVASTATIN 40 MG TABS (LOVASTATIN) 1 by mouth once daily  #90 x 3   Entered and Authorized by:   Corwin Levins MD   Signed by:   Corwin Levins MD on 11/14/2009   Method used:   Print then Give to Patient   RxID:   [id number] KETOPROF20%/LID2%/GABA5%P use asd at bedtime as needed  #60 gm x 1   Entered and Authorized by:   Corwin Levins MD   Signed by:   Corwin Levins MD on 11/14/2009   Method used:   Print then Give to Patient   RxID:   [id number]

## 2010-03-25 NOTE — Assessment & Plan Note (Signed)
Summary: PRESSURE IN VAGINAL AREA-LB   Vital Signs:  Patient profile:   77 year old female Height:      60 inches Weight:      125.50 pounds BMI:     24.60 O2 Sat:      94 % on Room air Temp:     97.4 degrees F oral Pulse rate:   86 / minute BP sitting:   120 / 68  (left arm) Cuff size:   regular  Vitals Entered By: Zella Ball Ewing CMA Duncan Dull) (November 25, 2009 4:28 PM)  O2 Flow:  Room air CC: Pressure in vaginal area/RE   CC:  Pressure in vaginal area/RE.  History of Present Illness: here with a concern, c/o pressure to the vaginal area since last sat (2 days) - sensation such as like tampax in place, no blood , no pain, no fever, and feels like pressure/lump  - only feels when stands , much less feeling  with lying down flat and even sitting. No sexually active recetnly; did observe with mirror herself a smooth pink mucosa in the lump area. Denies pelvic pain, or GYn, or GU symtpoms such as dysuria, urgency, freq or incontinence.  No abd pain, n/v, or blood  .  No prior hx of vaginal "mass" Pt denies CP, worsening sob, doe, wheezing, orthopnea, pnd, worsening LE edema, palps, dizziness or syncope  Pt denies new neuro symptoms such as headache, facial or extremity weakness   No change in recurring LBP, or LE radiation/weak/numb.  No worsening depressive symtpoms or panic.  Problems Prior to Update: 1)  Vaginal Mass  (ICD-625.8) 2)  Toe Pain  (ICD-729.5) 3)  Rash-nonvesicular  (ICD-782.1) 4)  Achilles Tendinitis  (ICD-726.71) 5)  Thumb Pain, Right  (ICD-729.5) 6)  Glucose Intolerance  (ICD-271.3) 7)  Preventive Health Care  (ICD-V70.0) 8)  Cough  (ICD-786.2) 9)  Hyperlipidemia  (ICD-272.4) 10)  Osteopenia  (ICD-733.90) 11)  Family History of Alcoholism/addiction  (ICD-V61.41) 12)  Allergic Rhinitis  (ICD-477.9) 13)  Low Back Pain  (ICD-724.2) 14)  Hypertension  (ICD-401.9) 15)  Depression  (ICD-311) 16)  Anxiety  (ICD-300.00) 17)  Herniated Disc  (ICD-722.2)  Medications Prior  to Update: 1)  Lovastatin 40 Mg Tabs (Lovastatin) .Marland Kitchen.. 1 By Mouth Once Daily 2)  Ecotrin Low Strength 81 Mg  Tbec (Aspirin) .Marland Kitchen.. 1 By Mouth Qd 3)  Amlodipine Besy-Benazepril Hcl 5-10 Mg Caps (Amlodipine Besy-Benazepril Hcl) .... 2 By Mouth Once Daily 4)  Cetirizine Hcl 10 Mg  Tabs (Cetirizine Hcl) .Marland Kitchen.. 1 By Mouth Once Daily 5)  Ketoprof20%/lid2%/gaba5%p .... Use Asd At Bedtime As Needed  Current Medications (verified): 1)  Lovastatin 40 Mg Tabs (Lovastatin) .Marland Kitchen.. 1 By Mouth Once Daily 2)  Ecotrin Low Strength 81 Mg  Tbec (Aspirin) .Marland Kitchen.. 1 By Mouth Qd 3)  Amlodipine Besy-Benazepril Hcl 5-10 Mg Caps (Amlodipine Besy-Benazepril Hcl) .... 2 By Mouth Once Daily 4)  Cetirizine Hcl 10 Mg  Tabs (Cetirizine Hcl) .Marland Kitchen.. 1 By Mouth Once Daily 5)  Ketoprof20%/lid2%/gaba5%p .... Use Asd At Bedtime As Needed  Allergies (verified): 1)  ! Codeine 2)  ! Sulfa 3)  ! Keflex 4)  ! Novocain 5)  ! Epinephrine 6)  ! * Asperatame 7)  ! Biaxin 8)  ! * Pneumovax 9)  ! 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   Head:  normocephalic and atraumatic.   Eyes:  vision grossly intact, pupils equal, and pupils round.   Ears:  R ear normal and L ear normal.   Nose:  no external deformity and no  nasal discharge.   Mouth:  no gingival abnormalities and pharynx pink and moist.   Neck:  supple and no masses.   Lungs:  normal respiratory effort and normal breath sounds.   Heart:  normal rate and regular rhythm.   Genitalia:  deferred per pt Extremities:  no edema, no erythema  Psych:  not depressed appearing and slightly anxious.     Impression & Recommendations:  Problem # 1:  VAGINAL MASS (ICD-625.8)  refer to GYN - dr neal per pt request for further eval  Orders: Gynecologic Referral (Gyn)  Problem # 2:  HYPERTENSION (ICD-401.9)  Her updated medication list for this problem includes:    Amlodipine Besy-benazepril Hcl 5-10 Mg Caps (Amlodipine besy-benazepril hcl) .Marland Kitchen... 2 by mouth once daily  BP today: 120/68 Prior BP: 128/84 (11/14/2009)  Prior 10 Yr Risk Heart Disease: 17 % (11/14/2009)  Labs Reviewed: K+: 4.2 (11/08/2009) Creat: : 0.8 (11/08/2009)   Chol: 209 (11/08/2009)   HDL: 41.70 (11/08/2009)   LDL: 112 (01/04/2009)   TG: 189.0 (11/08/2009) stable overall by hx and exam, ok to continue meds/tx as is   Problem # 3:  LOW BACK PAIN (ICD-724.2)  Her updated medication list for this problem includes:    Ecotrin Low Strength 81 Mg Tbec (Aspirin) .Marland Kitchen... 1 by mouth qd stable overall by hx and exam, ok to continue meds/tx as is   Problem # 4:  ANXIETY (ICD-300.00) stable overall by hx and exam, ok to continue meds/tx as is - situational, does not need further tx at this time  Complete Medication List: 1)  Lovastatin 40 Mg Tabs (Lovastatin) .Marland Kitchen.. 1 by mouth once daily 2)  Ecotrin Low Strength 81 Mg Tbec (Aspirin) .Marland Kitchen.. 1 by mouth qd 3)  Amlodipine Besy-benazepril Hcl 5-10 Mg Caps (Amlodipine besy-benazepril hcl) .... 2 by mouth once daily 4)  Cetirizine Hcl 10 Mg Tabs (Cetirizine hcl) .Marland Kitchen.. 1 by mouth once daily 5)  Ketoprof20%/lid2%/gaba5%p  .... Use asd at bedtime as needed  Patient Instructions: 1)  You will be contacted about the referral(s) to: Dr Jennette Kettle 2)   Continue all previous medications as before this visit  3)  Please schedule a follow-up appointment as needed.

## 2010-04-02 ENCOUNTER — Telehealth: Payer: Self-pay | Admitting: Internal Medicine

## 2010-04-10 NOTE — Progress Notes (Signed)
Summary: RX replacement  Phone Note Refill Request Message from:  Patient on April 02, 2010 9:49 AM  Refills Requested: Medication #1:  CETIRIZINE HCL 10 MG  TABS 1 by mouth once daily   Dosage confirmed as above?Dosage Confirmed   Supply Requested: 6 months Pt states she lost Rx that was given to her at [facility] 10/2009   Method Requested: Electronic Initial call taken by: Margaret Pyle, CMA,  April 02, 2010 9:48 AM    Prescriptions: CETIRIZINE HCL 10 MG  TABS (CETIRIZINE HCL) 1 by mouth once daily  #30 x 6   Entered by:   Margaret Pyle, CMA   Authorized by:   Corwin Levins MD   Signed by:   Margaret Pyle, CMA on 04/02/2010   Method used:   Electronically to        [facility] [phone]* (retail)       [address]       [place], [place]  [address]       Ph: [phone]       Fax: [phone]   RxID:   [id number]

## 2010-09-08 ENCOUNTER — Encounter: Payer: Self-pay | Admitting: *Deleted

## 2010-09-08 ENCOUNTER — Telehealth: Payer: Self-pay | Admitting: *Deleted

## 2010-09-08 DIAGNOSIS — R7302 Impaired glucose tolerance (oral): Secondary | ICD-10-CM

## 2010-09-08 DIAGNOSIS — Z0000 Encounter for general adult medical examination without abnormal findings: Secondary | ICD-10-CM | POA: Insufficient documentation

## 2010-09-08 HISTORY — DX: Encounter for general adult medical examination without abnormal findings: Z00.00

## 2010-09-08 HISTORY — DX: Impaired glucose tolerance (oral): R73.02

## 2010-09-08 NOTE — Telephone Encounter (Signed)
Ok for labs prior - done per [employer]

## 2010-09-08 NOTE — Telephone Encounter (Signed)
Pt scheduled CPE OV for 09/24/2010 and is confused as to if she needs to have her labs before visit, or if they will be ordered during visit.?

## 2010-09-08 NOTE — Telephone Encounter (Signed)
Only "regular" medicare does not allow blood work prior to OV  If she has a "[employer] like [place], or [place], she can have labs done before

## 2010-09-08 NOTE — Telephone Encounter (Signed)
Patient has [employer] and is requesting order for labs. [Informed pt labs to be done 3-5 days prior to OV]

## 2010-09-19 ENCOUNTER — Other Ambulatory Visit (INDEPENDENT_AMBULATORY_CARE_PROVIDER_SITE_OTHER): Payer: Self-pay

## 2010-09-19 DIAGNOSIS — R7309 Other abnormal glucose: Secondary | ICD-10-CM

## 2010-09-19 DIAGNOSIS — R7302 Impaired glucose tolerance (oral): Secondary | ICD-10-CM

## 2010-09-19 DIAGNOSIS — Z0000 Encounter for general adult medical examination without abnormal findings: Secondary | ICD-10-CM

## 2010-09-19 DIAGNOSIS — E785 Hyperlipidemia, unspecified: Secondary | ICD-10-CM

## 2010-09-19 LAB — HEPATIC FUNCTION PANEL
ALT: 19 U/L (ref 0–35)
AST: 23 U/L (ref 0–37)
Albumin: 4.7 g/dL (ref 3.5–5.2)
Alkaline Phosphatase: 58 U/L (ref 39–117)
Bilirubin, Direct: 0.1 mg/dL (ref 0.0–0.3)
Total Bilirubin: 0.7 mg/dL (ref 0.3–1.2)
Total Protein: 7.9 g/dL (ref 6.0–8.3)

## 2010-09-19 LAB — CBC WITH DIFFERENTIAL/PLATELET
Basophils Absolute: 0 10*3/uL (ref 0.0–0.1)
Basophils Relative: 0.5 % (ref 0.0–3.0)
Eosinophils Absolute: 0.1 10*3/uL (ref 0.0–0.7)
Eosinophils Relative: 1.5 % (ref 0.0–5.0)
HCT: 43 % (ref 36.0–46.0)
Hemoglobin: 14.5 g/dL (ref 12.0–15.0)
Lymphocytes Relative: 29.9 % (ref 12.0–46.0)
Lymphs Abs: 2.2 10*3/uL (ref 0.7–4.0)
MCHC: 33.7 g/dL (ref 30.0–36.0)
MCV: 92.2 fl (ref 78.0–100.0)
Monocytes Absolute: 0.5 10*3/uL (ref 0.1–1.0)
Monocytes Relative: 6 % (ref 3.0–12.0)
Neutro Abs: 4.7 10*3/uL (ref 1.4–7.7)
Neutrophils Relative %: 62.1 % (ref 43.0–77.0)
Platelets: 231 10*3/uL (ref 150.0–400.0)
RBC: 4.67 Mil/uL (ref 3.87–5.11)
RDW: 13.9 % (ref 11.5–14.6)
WBC: 7.5 10*3/uL (ref 4.5–10.5)

## 2010-09-19 LAB — LIPID PANEL
Cholesterol: 186 mg/dL (ref 0–200)
HDL: 56.1 mg/dL (ref >39.00)
LDL Cholesterol: 108 mg/dL — ABNORMAL HIGH (ref 0–99)
Total CHOL/HDL Ratio: 3
Triglycerides: 109 mg/dL (ref 0.0–149.0)
VLDL: 21.8 mg/dL (ref 0.0–40.0)

## 2010-09-19 LAB — BASIC METABOLIC PANEL
BUN: 19 mg/dL (ref 6–23)
CO2: 25 mEq/L (ref 19–32)
Calcium: 9.2 mg/dL (ref 8.4–10.5)
Chloride: 107 mEq/L (ref 96–112)
Creatinine, Ser: 0.8 mg/dL (ref 0.4–1.2)
GFR: 79.44 mL/min (ref >60.00)
Glucose, Bld: 106 mg/dL — ABNORMAL HIGH (ref 70–99)
Potassium: 4.3 mEq/L (ref 3.5–5.1)
Sodium: 142 mEq/L (ref 135–145)

## 2010-09-19 LAB — URINALYSIS, ROUTINE W REFLEX MICROSCOPIC
Ketones, ur: NEGATIVE
Leukocytes, UA: NEGATIVE
Nitrite: NEGATIVE
Specific Gravity, Urine: 1.015 (ref 1.000–1.030)
Urobilinogen, UA: 0.2 (ref 0.0–1.0)

## 2010-09-19 LAB — TSH: TSH: 1.56 u[IU]/mL (ref 0.35–5.50)

## 2010-09-24 ENCOUNTER — Encounter: Payer: Self-pay | Admitting: Internal Medicine

## 2010-09-24 ENCOUNTER — Ambulatory Visit: Payer: Self-pay | Admitting: Internal Medicine

## 2010-09-24 ENCOUNTER — Ambulatory Visit (INDEPENDENT_AMBULATORY_CARE_PROVIDER_SITE_OTHER): Payer: Medicare Other | Admitting: Internal Medicine

## 2010-09-24 VITALS — BP 132/72 | HR 102 | Temp 98.7°F | Ht 60.0 in | Wt 128.1 lb

## 2010-09-24 DIAGNOSIS — R7309 Other abnormal glucose: Secondary | ICD-10-CM

## 2010-09-24 DIAGNOSIS — R7302 Impaired glucose tolerance (oral): Secondary | ICD-10-CM

## 2010-09-24 DIAGNOSIS — Z0000 Encounter for general adult medical examination without abnormal findings: Secondary | ICD-10-CM

## 2010-09-24 MED ORDER — AMLODIPINE-ATORVASTATIN 5-10 MG PO TABS: 2.0000 | ORAL_TABLET | Freq: Every day | ORAL | Status: DC

## 2010-09-24 MED ORDER — LOVASTATIN 40 MG PO TABS: 40.0000 mg | ORAL_TABLET | Freq: Every day | ORAL | Status: DC

## 2010-09-24 NOTE — Assessment & Plan Note (Signed)

## 2010-09-24 NOTE — Patient Instructions (Signed)
Continue all other medications as before Please return in 1 year for your yearly visit, or sooner if needed, with Lab testing done 3-5 days before  
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
## 2010-12-10 ENCOUNTER — Other Ambulatory Visit: Payer: Self-pay | Admitting: Internal Medicine

## 2010-12-10 MED ORDER — AMLODIPINE BESY-BENAZEPRIL HCL 5-10 MG PO CAPS: ORAL_CAPSULE | ORAL | Status: DC

## 2011-09-25 ENCOUNTER — Other Ambulatory Visit (INDEPENDENT_AMBULATORY_CARE_PROVIDER_SITE_OTHER): Payer: Medicare Other

## 2011-09-25 DIAGNOSIS — R7309 Other abnormal glucose: Secondary | ICD-10-CM

## 2011-09-25 DIAGNOSIS — Z0000 Encounter for general adult medical examination without abnormal findings: Secondary | ICD-10-CM

## 2011-09-25 DIAGNOSIS — R7302 Impaired glucose tolerance (oral): Secondary | ICD-10-CM

## 2011-09-25 DIAGNOSIS — Z79899 Other long term (current) drug therapy: Secondary | ICD-10-CM

## 2011-09-25 LAB — HEPATIC FUNCTION PANEL
AST: 22 U/L (ref 0–37)
Albumin: 4.5 g/dL (ref 3.5–5.2)
Alkaline Phosphatase: 54 U/L (ref 39–117)
Bilirubin, Direct: 0.1 mg/dL (ref 0.0–0.3)

## 2011-09-25 LAB — BASIC METABOLIC PANEL
BUN: 14 mg/dL (ref 6–23)
CO2: 24 mEq/L (ref 19–32)
Calcium: 9.5 mg/dL (ref 8.4–10.5)
Creatinine, Ser: 0.8 mg/dL (ref 0.4–1.2)

## 2011-09-25 LAB — CBC WITH DIFFERENTIAL/PLATELET
Basophils Absolute: 0.1 10*3/uL (ref 0.0–0.1)
Basophils Relative: 0.6 % (ref 0.0–3.0)
Hemoglobin: 14.4 g/dL (ref 12.0–15.0)
Lymphocytes Relative: 26.9 % (ref 12.0–46.0)
Monocytes Relative: 5.3 % (ref 3.0–12.0)
Neutro Abs: 5.2 10*3/uL (ref 1.4–7.7)
Neutrophils Relative %: 65.9 % (ref 43.0–77.0)
RBC: 4.65 Mil/uL (ref 3.87–5.11)
RDW: 13.6 % (ref 11.5–14.6)

## 2011-09-25 LAB — LIPID PANEL
LDL Cholesterol: 114 mg/dL — ABNORMAL HIGH (ref 0–99)
Total CHOL/HDL Ratio: 4
Triglycerides: 151 mg/dL — ABNORMAL HIGH (ref 0.0–149.0)

## 2011-09-25 LAB — URINALYSIS, ROUTINE W REFLEX MICROSCOPIC
Ketones, ur: NEGATIVE
Specific Gravity, Urine: 1.01 (ref 1.000–1.030)
Total Protein, Urine: NEGATIVE
Urine Glucose: NEGATIVE
pH: 5.5 (ref 5.0–8.0)

## 2011-09-25 LAB — HEMOGLOBIN A1C: Hgb A1c MFr Bld: 5.7 % (ref 4.6–6.5)

## 2011-09-25 LAB — TSH: TSH: 1.71 u[IU]/mL (ref 0.35–5.50)

## 2011-09-30 ENCOUNTER — Ambulatory Visit (INDEPENDENT_AMBULATORY_CARE_PROVIDER_SITE_OTHER): Payer: Medicare Other | Admitting: Internal Medicine

## 2011-09-30 ENCOUNTER — Encounter: Payer: Self-pay | Admitting: Internal Medicine

## 2011-09-30 VITALS — BP 142/80 | HR 88 | Temp 98.2°F | Ht 60.0 in | Wt 125.1 lb

## 2011-09-30 DIAGNOSIS — M5430 Sciatica, unspecified side: Secondary | ICD-10-CM

## 2011-09-30 DIAGNOSIS — M76899 Other specified enthesopathies of unspecified lower limb, excluding foot: Secondary | ICD-10-CM

## 2011-09-30 DIAGNOSIS — M7071 Other bursitis of hip, right hip: Secondary | ICD-10-CM

## 2011-09-30 DIAGNOSIS — M5432 Sciatica, left side: Secondary | ICD-10-CM

## 2011-09-30 DIAGNOSIS — Z0000 Encounter for general adult medical examination without abnormal findings: Secondary | ICD-10-CM

## 2011-09-30 MED ORDER — AMLODIPINE BESY-BENAZEPRIL HCL 5-10 MG PO CAPS: ORAL_CAPSULE | ORAL | Status: DC

## 2011-09-30 MED ORDER — LOVASTATIN 40 MG PO TABS: 40.0000 mg | ORAL_TABLET | Freq: Every day | ORAL | Status: DC

## 2011-09-30 MED ORDER — CETIRIZINE HCL 10 MG PO TABS: 10.0000 mg | ORAL_TABLET | Freq: Every day | ORAL | Status: AC

## 2011-09-30 NOTE — Progress Notes (Signed)
Subjective:    Patient ID: Brandi Schaefer, female    DOB: 12-20-1930, 78 y.o.   MRN: [id number]  HPIHere for wellness and f/u;  Overall doing ok;  Pt denies CP, worsening SOB, DOE, wheezing, orthopnea, PND, worsening LE edema, palpitations, dizziness or syncope.  Pt denies neurological change such as new Headache, facial or extremity weakness.  Pt denies polydipsia, polyuria, or low sugar symptoms. Pt states overall good compliance with treatment and medications, good tolerability, and trying to follow lower cholesterol diet.  Pt denies worsening depressive symptoms, suicidal ideation or panic. No fever, wt loss, night sweats, loss of appetite, or other constitutional symptoms.  Pt states good ability with ADL's, low fall risk, home safety reviewed and adequate, no significant changes in hearing or vision, and occasionally active with exercise.  Did have some left hip pain after silver sneaker exercise, saw ortho apr 29 Dr Melrose Nakayama, dx with "strain of sciatic nerve" per pt, tx with nsaid but caused GI upset, states called his office but unable to be seen within a month, so she did not make appt.  Today states pain to the left lower back as worsened, and has pain below the knee as well, worse with prolonged standing, intermittent, mild but occas severe.  No weakness or numbness today, though did have some numbness in April to foot and toes.  Also with pain today to the right lateral hip localized, mild to mod, tender, worse to bend forward and prolonged standing, wihtout radaition or LE symptoms.  No bowel or bladder change, gait change or falls.  No fever.  Has hx of lumbar disc dz from MRI 2005. Past Medical History  Diagnosis Date  . Impaired glucose tolerance 09/08/2010  . ACHILLES TENDINITIS 06/06/2008  . ALLERGIC RHINITIS 04/08/2007  . ANXIETY 10/15/2006  . Cough 08/11/2007  . DEPRESSION 10/15/2006  . HERNIATED DISC 10/15/2006  . HYPERLIPIDEMIA 04/08/2007  . HYPERTENSION 10/15/2006  . LOW BACK PAIN 10/15/2006   . OSTEOPENIA 04/08/2007  . Pain in soft tissues of limb 06/06/2008  . Preventative health care 09/08/2010  . RASH-NONVESICULAR 01/07/2009  . VAGINAL MASS 11/25/2009   Past Surgical History  Procedure Date  . Abdominal hysterectomy   . Cholecystectomy   . S/p squamous cell ca left arm   . S/p squamous cell right back      Dr. Terri Piedra  . Bladder suspension   . Rectocele repair     reports that she has been smoking.  She does not have any smokeless tobacco history on file. She reports that she does not drink alcohol or use illicit drugs. family history includes Alcohol abuse in her other; COPD in her maternal grandfather; Cancer in her other; Dementia in her other; Hypertension in her other; and Stroke in her other. Allergies  Allergen Reactions  . Cephalexin   . Clarithromycin   . Codeine   . Epinephrine   . Meloxicam Nausea And Vomiting  . Pneumovax (Pneumococcal Polysaccharides)   . Procaine Hcl   . Sulfonamide Derivatives    Current Outpatient Prescriptions on File Prior to Visit  Medication Sig Dispense Refill  . aspirin 81 MG tablet Take 81 mg by mouth daily.        Marland Kitchen DISCONTD: amLODipine-benazepril (LOTREL) 5-10 MG per capsule 2 tab by mouth daily  60 capsule  11  . DISCONTD: cetirizine (ZYRTEC) 10 MG tablet Take 10 mg by mouth daily.        Marland Kitchen DISCONTD: lovastatin (MEVACOR) 40 MG tablet Take  1 tablet (40 mg total) by mouth at bedtime.  90 tablet  3   Review of Systems Review of Systems  Constitutional: Negative for diaphoresis, activity change, appetite change and unexpected weight change.  HENT: Negative for hearing loss, ear pain, facial swelling, mouth sores and neck stiffness.   Eyes: Negative for pain, redness and visual disturbance.  Respiratory: Negative for shortness of breath and wheezing.   Cardiovascular: Negative for chest pain and palpitations.  Gastrointestinal: Negative for diarrhea, blood in stool, abdominal distention and rectal pain.  Genitourinary:  Negative for hematuria, flank pain and decreased urine volume.  Musculoskeletal: Negative for myalgias and joint swelling.  Skin: Negative for color change and wound.  Neurological: Negative for syncope and numbness.  Hematological: Negative for adenopathy.  Psychiatric/Behavioral: Negative for hallucinations, self-injury, decreased concentration and agitation.      Objective:   Physical Exam BP 142/80  Pulse 88  Temp 98.2 F (36.8 C) (Oral)  Ht 5' (1.524 m)  Wt 125 lb 2 oz (56.756 kg)  BMI 24.44 kg/m2  SpO2 93% Physical Exam  VS noted, not ill appearing Constitutional: Pt is oriented to person, place, and time. Appears well-developed and well-nourished.  HENT:  Head: Normocephalic and atraumatic.  Right Ear: External ear normal.  Left Ear: External ear normal.  Nose: Nose normal.  Mouth/Throat: Oropharynx is clear and moist.  Eyes: Conjunctivae and EOM are normal. Pupils are equal, round, and reactive to light.  Neck: Normal range of motion. Neck supple. No JVD present. No tracheal deviation present.  Cardiovascular: Normal rate, regular rhythm, normal heart sounds and intact distal pulses.   Pulmonary/Chest: Effort normal and breath sounds normal.  Abdominal: Soft. Bowel sounds are normal. There is no tenderness.  Musculoskeletal: Normal range of motion. Exhibits no edema.  Lymphadenopathy:  Has no cervical adenopathy.  Neurological: Pt is alert and oriented to person, place, and time. Pt has normal reflexes. No cranial nerve deficit. Motor/dtr/gait intact Skin: Skin is warm and dry. No rash noted.  Spine;  Nontender, no paravertebral tender Right lateral hip tender over greater trochanter Psychiatric:  Has  normal mood and affect except mild nervous. Behavior is normal.     Assessment & Plan:

## 2011-09-30 NOTE — Assessment & Plan Note (Addendum)
Overall doing well, age appropriate education and counseling updated, referrals for preventative services and immunizations addressed, dietary and smoking counseling addressed, most recent labs and ECG reviewed.  I have personally reviewed and have noted: 1) the patient's medical and social history 2) The pt's use of alcohol, tobacco, and illicit drugs 3) The patient's current medications and supplements 4) Functional ability including ADL's, fall risk, home safety risk, hearing and visual impairment 5) Diet and physical activities 6) Evidence for depression or mood disorder 7) The patient's height, weight, and BMI have been recorded in the chart I have made referrals, and provided counseling and education based on review of the above ECG reviewed as per emr, declines colonoscopy , immunizations

## 2011-09-30 NOTE — Patient Instructions (Addendum)
Continue all other medications as before Your medications were refilled today Please call if you need an orthopedic referral for the right hip bursitis, or the left sciatica worsening Please call if you change your mind about the screening colonscopy Please remember to followup with your GYN for the yearly pap smear and/or mammogram (if you want) Please return in 1 year for your yearly visit, or sooner if needed, with Lab testing done 3-5 days before

## 2011-10-03 ENCOUNTER — Encounter: Payer: Self-pay | Admitting: Internal Medicine

## 2011-10-03 DIAGNOSIS — M7071 Other bursitis of hip, right hip: Secondary | ICD-10-CM | POA: Insufficient documentation

## 2011-10-03 DIAGNOSIS — M5432 Sciatica, left side: Secondary | ICD-10-CM | POA: Insufficient documentation

## 2011-10-03 NOTE — Assessment & Plan Note (Signed)
Mild to mod, declines further tx or ortho referral

## 2011-10-03 NOTE — Assessment & Plan Note (Signed)
Mild, o/w stable, declines ortho eval or other eval at this time

## 2011-12-16 ENCOUNTER — Other Ambulatory Visit: Payer: Self-pay

## 2011-12-16 NOTE — Telephone Encounter (Signed)
Phoned in rx per MD instructions to [facility] for Ketoprof 20%/lid 5%/Gaba 5% #2 bottles per MD instructions.  Patient informed and pharmacy will call once ready to pickup.

## 2012-01-22 ENCOUNTER — Other Ambulatory Visit: Payer: Self-pay | Admitting: Internal Medicine

## 2012-10-05 ENCOUNTER — Encounter: Payer: Medicare Other | Admitting: Internal Medicine

## 2012-11-04 ENCOUNTER — Telehealth: Payer: Self-pay

## 2012-11-04 ENCOUNTER — Other Ambulatory Visit: Payer: Self-pay | Admitting: Internal Medicine

## 2012-11-04 ENCOUNTER — Other Ambulatory Visit (INDEPENDENT_AMBULATORY_CARE_PROVIDER_SITE_OTHER): Payer: Medicare Other

## 2012-11-04 DIAGNOSIS — E785 Hyperlipidemia, unspecified: Secondary | ICD-10-CM

## 2012-11-04 DIAGNOSIS — Z0000 Encounter for general adult medical examination without abnormal findings: Secondary | ICD-10-CM

## 2012-11-04 DIAGNOSIS — I10 Essential (primary) hypertension: Secondary | ICD-10-CM

## 2012-11-04 LAB — CBC WITH DIFFERENTIAL/PLATELET
Basophils Relative: 0.4 % (ref 0.0–3.0)
Eosinophils Absolute: 0.1 10*3/uL (ref 0.0–0.7)
Eosinophils Relative: 1.1 % (ref 0.0–5.0)
Lymphocytes Relative: 23.3 % (ref 12.0–46.0)
Neutrophils Relative %: 70.9 % (ref 43.0–77.0)
Platelets: 261 10*3/uL (ref 150.0–400.0)
RBC: 4.84 Mil/uL (ref 3.87–5.11)
WBC: 8.1 10*3/uL (ref 4.5–10.5)

## 2012-11-04 LAB — LIPID PANEL
HDL: 46.2 mg/dL (ref >39.00)
LDL Cholesterol: 117 mg/dL — ABNORMAL HIGH (ref 0–99)
Total CHOL/HDL Ratio: 4
VLDL: 32.2 mg/dL (ref 0.0–40.0)

## 2012-11-04 LAB — URINALYSIS, ROUTINE W REFLEX MICROSCOPIC
Bilirubin Urine: NEGATIVE
Leukocytes, UA: NEGATIVE
Nitrite: NEGATIVE
Total Protein, Urine: NEGATIVE
Urobilinogen, UA: 0.2 (ref 0.0–1.0)

## 2012-11-04 LAB — HEPATIC FUNCTION PANEL
ALT: 15 U/L (ref 0–35)
AST: 21 U/L (ref 0–37)
Alkaline Phosphatase: 52 U/L (ref 39–117)
Total Bilirubin: 0.9 mg/dL (ref 0.3–1.2)

## 2012-11-04 LAB — BASIC METABOLIC PANEL
BUN: 14 mg/dL (ref 6–23)
Chloride: 106 mEq/L (ref 96–112)
Potassium: 4 mEq/L (ref 3.5–5.1)
Sodium: 139 mEq/L (ref 135–145)

## 2012-11-04 LAB — TSH: TSH: 1.99 u[IU]/mL (ref 0.35–5.50)

## 2012-11-04 NOTE — Telephone Encounter (Signed)
CPX labs entered  

## 2012-11-09 ENCOUNTER — Encounter: Payer: Self-pay | Admitting: Internal Medicine

## 2012-11-09 ENCOUNTER — Ambulatory Visit (INDEPENDENT_AMBULATORY_CARE_PROVIDER_SITE_OTHER): Payer: Medicare Other | Admitting: Internal Medicine

## 2012-11-09 VITALS — BP 132/80 | HR 80 | Temp 99.0°F | Ht 60.0 in | Wt 125.4 lb

## 2012-11-09 DIAGNOSIS — H9120 Sudden idiopathic hearing loss, unspecified ear: Secondary | ICD-10-CM

## 2012-11-09 DIAGNOSIS — Z0000 Encounter for general adult medical examination without abnormal findings: Secondary | ICD-10-CM

## 2012-11-09 DIAGNOSIS — H9121 Sudden idiopathic hearing loss, right ear: Secondary | ICD-10-CM

## 2012-11-09 DIAGNOSIS — G5603 Carpal tunnel syndrome, bilateral upper limbs: Secondary | ICD-10-CM

## 2012-11-09 DIAGNOSIS — G5761 Lesion of plantar nerve, right lower limb: Secondary | ICD-10-CM

## 2012-11-09 DIAGNOSIS — G5600 Carpal tunnel syndrome, unspecified upper limb: Secondary | ICD-10-CM

## 2012-11-09 DIAGNOSIS — G5760 Lesion of plantar nerve, unspecified lower limb: Secondary | ICD-10-CM

## 2012-11-09 MED ORDER — KETOPROFEN-LIDO-GABAPENTIN 5-2-5 % EX CREA: 1.0000 "application " | TOPICAL_CREAM | Freq: Two times a day (BID) | CUTANEOUS | Status: AC | PRN

## 2012-11-09 NOTE — Assessment & Plan Note (Signed)

## 2012-11-09 NOTE — Assessment & Plan Note (Signed)
Ok for cream refill, will ned prior [place]

## 2012-11-09 NOTE — Progress Notes (Signed)
Subjective:    Patient ID: Brandi Schaefer, female    DOB: 10/03/1930, 79 y.o.   MRN: [id number]  HPI  Here for wellness and f/u;  Overall doing ok;  Pt denies CP, worsening SOB, DOE, wheezing, orthopnea, PND, worsening LE edema, palpitations, dizziness or syncope.  Pt denies neurological change such as new headache, facial or extremity weakness.  Pt denies polydipsia, polyuria, or low sugar symptoms. Pt states overall good compliance with treatment and medications, good tolerability, and has been trying to follow lower cholesterol diet.  Pt denies worsening depressive symptoms, suicidal ideation or panic. No fever, night sweats, wt loss, loss of appetite, or other constitutional symptoms.  Pt states good ability with ADL's, has low fall risk, home safety reviewed and adequate, no other significant changes in hearing or vision, and only occasionally active with exercise.   S/p cataract x 2 , sees well now with glasses.  No new complaints.  Declines immunizations and colonoscopy.  Does have positional numbness to either hand at different tims without pain or weakness.  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 Impaired glucose tolerance 09/08/2010  . ACHILLES TENDINITIS 06/06/2008  . ALLERGIC RHINITIS 04/08/2007  . ANXIETY 10/15/2006  . Cough 08/11/2007  . DEPRESSION 10/15/2006  . HERNIATED DISC 10/15/2006  . HYPERLIPIDEMIA 04/08/2007  . HYPERTENSION 10/15/2006  . LOW BACK PAIN 10/15/2006  . OSTEOPENIA 04/08/2007  . Pain in soft tissues of limb 06/06/2008  . Preventative health care 09/08/2010  . RASH-NONVESICULAR 01/07/2009  . VAGINAL MASS 11/25/2009   Past Surgical History  Procedure Laterality Date  . Abdominal hysterectomy    . Cholecystectomy    . S/p squamous cell ca left arm    . S/p squamous cell right back       Dr. Terri Piedra  . Bladder suspension    . Rectocele repair      reports that she has been smoking.  She  does not have any smokeless tobacco history on file. She reports that she does not drink alcohol or use illicit drugs. family history includes Alcohol abuse in her other; COPD in her maternal grandfather; Cancer in her other; Dementia in her other; Hypertension in her other; Stroke in her other.   Past Medical History  Diagnosis Date  . Impaired glucose tolerance 09/08/2010  . ACHILLES TENDINITIS 06/06/2008  . ALLERGIC RHINITIS 04/08/2007  . ANXIETY 10/15/2006  . Cough 08/11/2007  . DEPRESSION 10/15/2006  . HERNIATED DISC 10/15/2006  . HYPERLIPIDEMIA 04/08/2007  . HYPERTENSION 10/15/2006  . LOW BACK PAIN 10/15/2006  . OSTEOPENIA 04/08/2007  . Pain in soft tissues of limb 06/06/2008  . Preventative health care 09/08/2010  . RASH-NONVESICULAR 01/07/2009  . VAGINAL MASS 11/25/2009   Past Surgical History  Procedure Laterality Date  . Abdominal hysterectomy    . Cholecystectomy    . S/p squamous cell ca left arm    . S/p squamous cell right back       Dr. Terri Piedra  . Bladder suspension    . Rectocele repair      reports that she has been smoking.  She does not have any smokeless tobacco history on file. She reports that she does not drink alcohol or use illicit drugs. family history includes Alcohol abuse in her other; COPD in her maternal grandfather; Cancer in her other; Dementia in her other; Hypertension in her other; Stroke in her other. Allergies  Allergen Reactions  . Cephalexin   . Clarithromycin   . Codeine   . Epinephrine   . Meloxicam Nausea And Vomiting  . Pneumovax [Pneumococcal Polysaccharides]   . Procaine Hcl   . Sulfonamide Derivatives    Current Outpatient Prescriptions on File Prior to Visit  Medication Sig Dispense Refill  . amLODipine-benazepril (LOTREL) 5-10 MG per capsule TAKE 2 CAPSULES BY MOUTH EVERY DAY  60 capsule  7  . aspirin 81 MG tablet Take 81 mg by mouth daily.        . cetirizine (ZYRTEC) 10 MG tablet Take 1 tablet (10 mg total) by mouth daily.  90  tablet  3  . lovastatin (MEVACOR) 40 MG tablet Take 1 tablet (40 mg total) by mouth at bedtime.  90 tablet  3  . lovastatin (MEVACOR) 40 MG tablet TAKE 1 TABLET AT BEDTIME  90 tablet  2   No current facility-administered medications on file prior to visit.   Review of Systems Constitutional: Negative for diaphoresis, activity change, appetite change or unexpected weight change.  HENT: Negative for hearing loss, ear pain, facial swelling, mouth sores and neck stiffness.   Eyes: Negative for pain, redness and visual disturbance.  Respiratory: Negative for shortness of breath and wheezing.   Cardiovascular: Negative for chest pain and palpitations.  Gastrointestinal: Negative for diarrhea, blood in stool, abdominal distention or other pain Genitourinary: Negative for hematuria, flank pain or change in urine volume.  Musculoskeletal: Negative for myalgias and joint swelling.  Skin: Negative for color change and wound.  Neurological: Negative for syncope and numbness. other than noted Hematological: Negative for adenopathy.  Psychiatric/Behavioral: Negative for hallucinations, self-injury, decreased concentration and agitation.      Objective:   Physical Exam BP 132/80  Pulse 80  Temp(Src) 99 F (37.2 C) (Oral)  Ht 5' (1.524 m)  Wt 125 lb 6 oz (56.87 kg)  BMI 24.49 kg/m2  SpO2 97% VS noted,  Constitutional: Pt is oriented to person, place, and time. Appears well-developed and well-nourished.  Head: Normocephalic and atraumatic.  Right Ear: External ear normal.  Left Ear: External ear normal.  Nose: Nose normal.  Mouth/Throat: Oropharynx is clear and moist.  Eyes: Conjunctivae and EOM are normal. Pupils are equal, round, and reactive to light.  Neck: Normal range of motion. Neck supple. No JVD present. No tracheal deviation present.  Cardiovascular: Normal rate, regular rhythm, normal heart sounds and intact distal pulses.   Pulmonary/Chest: Effort normal and breath sounds normal.   Abdominal: Soft. Bowel sounds are normal. There is no tenderness. No HSM  Musculoskeletal: Normal range of motion. Exhibits no edema.  Lymphadenopathy:  Has no cervical adenopathy.  Neurological: Pt is alert and oriented to person, place, and time. Pt has normal reflexes. No cranial nerve deficit.  Skin: Skin is warm and dry. No rash noted.  Psychiatric:  Has  normal mood and affect. Behavior is normal.     Assessment & Plan:

## 2012-11-09 NOTE — Assessment & Plan Note (Signed)
Improved with wax removal

## 2012-11-09 NOTE — Assessment & Plan Note (Signed)
Mild exam benign, for wrist splints at night

## 2012-11-09 NOTE — Patient Instructions (Signed)
Your right ear was irrigated of wax today Please wear the wrist splints at night to help with the numbness during the day Your cream will be refilled and attempted to authorize Please continue all other medications as before, and refills have been done if requested per Robin Please have the pharmacy call with any other refills you may need. Please continue your efforts at being more active, low cholesterol diet, and weight control. You are otherwise up to date with prevention measures today. Please let us know if you change your mind about the shots or colonoscopy  Please return in 6 months, or sooner if needed

## 2012-11-14 ENCOUNTER — Telehealth: Payer: Self-pay | Admitting: *Deleted

## 2012-11-14 NOTE — Telephone Encounter (Signed)
Angie from [facility] called requesting concentration clarification on Ketoprofen-Lido-Gabapentin Cream, states pt says concentration should be 20-5-5 Rx written as 5-2-5.  Please advise

## 2012-11-14 NOTE — Telephone Encounter (Signed)
Ok to change, to robin to authorize verbal

## 2012-11-17 NOTE — Telephone Encounter (Signed)
Complete by Brandi Schaefer

## 2013-04-04 ENCOUNTER — Other Ambulatory Visit: Payer: Self-pay | Admitting: Internal Medicine

## 2013-09-11 ENCOUNTER — Other Ambulatory Visit: Payer: Self-pay | Admitting: Internal Medicine

## 2013-12-26 ENCOUNTER — Ambulatory Visit (INDEPENDENT_AMBULATORY_CARE_PROVIDER_SITE_OTHER)
Admission: RE | Admit: 2013-12-26 | Discharge: 2013-12-26 | Disposition: A | Discharge status: Home / Self Care | Payer: Medicare Other | Source: Ambulatory Visit | Attending: Internal Medicine | Admitting: Internal Medicine

## 2013-12-26 ENCOUNTER — Other Ambulatory Visit (INDEPENDENT_AMBULATORY_CARE_PROVIDER_SITE_OTHER): Payer: Medicare Other

## 2013-12-26 ENCOUNTER — Encounter: Payer: Self-pay | Admitting: Internal Medicine

## 2013-12-26 ENCOUNTER — Ambulatory Visit (INDEPENDENT_AMBULATORY_CARE_PROVIDER_SITE_OTHER): Payer: Medicare Other | Admitting: Internal Medicine

## 2013-12-26 VITALS — BP 130/82 | HR 98 | Temp 98.5°F | Ht 59.0 in | Wt 121.0 lb

## 2013-12-26 DIAGNOSIS — F32A Depression, unspecified: Secondary | ICD-10-CM

## 2013-12-26 DIAGNOSIS — F329 Major depressive disorder, single episode, unspecified: Secondary | ICD-10-CM

## 2013-12-26 DIAGNOSIS — I10 Essential (primary) hypertension: Secondary | ICD-10-CM

## 2013-12-26 DIAGNOSIS — Z0000 Encounter for general adult medical examination without abnormal findings: Secondary | ICD-10-CM

## 2013-12-26 DIAGNOSIS — R05 Cough: Secondary | ICD-10-CM

## 2013-12-26 DIAGNOSIS — R059 Cough, unspecified: Secondary | ICD-10-CM | POA: Insufficient documentation

## 2013-12-26 DIAGNOSIS — R7302 Impaired glucose tolerance (oral): Secondary | ICD-10-CM

## 2013-12-26 LAB — CBC WITH DIFFERENTIAL/PLATELET
BASOS ABS: 0.2 10*3/uL — AB (ref 0.0–0.1)
BASOS PCT: 2.2 % (ref 0.0–3.0)
Eosinophils Absolute: 0.1 10*3/uL (ref 0.0–0.7)
Eosinophils Relative: 1.1 % (ref 0.0–5.0)
HCT: 43.3 % (ref 36.0–46.0)
HEMOGLOBIN: 14.3 g/dL (ref 12.0–15.0)
Lymphocytes Relative: 25.9 % (ref 12.0–46.0)
Lymphs Abs: 2.2 10*3/uL (ref 0.7–4.0)
MCHC: 33 g/dL (ref 30.0–36.0)
MCV: 91.5 fl (ref 78.0–100.0)
Monocytes Absolute: 0.4 10*3/uL (ref 0.1–1.0)
Monocytes Relative: 4.9 % (ref 3.0–12.0)
Neutro Abs: 5.7 10*3/uL (ref 1.4–7.7)
Neutrophils Relative %: 65.9 % (ref 43.0–77.0)
Platelets: 315 10*3/uL (ref 150.0–400.0)
RBC: 4.73 Mil/uL (ref 3.87–5.11)
RDW: 13.5 % (ref 11.5–15.5)
WBC: 8.6 10*3/uL (ref 4.0–10.5)

## 2013-12-26 LAB — BASIC METABOLIC PANEL
BUN: 12 mg/dL (ref 6–23)
CALCIUM: 9.5 mg/dL (ref 8.4–10.5)
CO2: 23 meq/L (ref 19–32)
Chloride: 103 mEq/L (ref 96–112)
Creatinine, Ser: 0.9 mg/dL (ref 0.4–1.2)
GFR: 63.83 mL/min (ref >60.00)
GLUCOSE: 103 mg/dL — AB (ref 70–99)
Potassium: 3.5 mEq/L (ref 3.5–5.1)
Sodium: 138 mEq/L (ref 135–145)

## 2013-12-26 LAB — HEPATIC FUNCTION PANEL
ALBUMIN: 4 g/dL (ref 3.5–5.2)
ALT: 17 U/L (ref 0–35)
AST: 23 U/L (ref 0–37)
Alkaline Phosphatase: 64 U/L (ref 39–117)
Bilirubin, Direct: 0.1 mg/dL (ref 0.0–0.3)
Total Bilirubin: 0.7 mg/dL (ref 0.2–1.2)
Total Protein: 7.8 g/dL (ref 6.0–8.3)

## 2013-12-26 LAB — LIPID PANEL
CHOLESTEROL: 191 mg/dL (ref 0–200)
HDL: 49.1 mg/dL (ref >39.00)
LDL CALC: 118 mg/dL — AB (ref 0–99)
NonHDL: 141.9
Total CHOL/HDL Ratio: 4
Triglycerides: 120 mg/dL (ref 0.0–149.0)
VLDL: 24 mg/dL (ref 0.0–40.0)

## 2013-12-26 LAB — URINALYSIS, ROUTINE W REFLEX MICROSCOPIC
Bilirubin Urine: NEGATIVE
Hgb urine dipstick: NEGATIVE
Ketones, ur: NEGATIVE
Leukocytes, UA: NEGATIVE
Nitrite: NEGATIVE
RBC / HPF: NONE SEEN (ref 0–?)
Specific Gravity, Urine: 1.02 (ref 1.000–1.030)
Total Protein, Urine: NEGATIVE
UROBILINOGEN UA: 0.2 (ref 0.0–1.0)
Urine Glucose: NEGATIVE
pH: 5.5 (ref 5.0–8.0)

## 2013-12-26 MED ORDER — AMLODIPINE BESY-BENAZEPRIL HCL 5-10 MG PO CAPS: 2.0000 | ORAL_CAPSULE | Freq: Every day | ORAL | Status: AC

## 2013-12-26 MED ORDER — BENZONATATE 100 MG PO CAPS: ORAL_CAPSULE | ORAL | Status: AC

## 2013-12-26 MED ORDER — LOVASTATIN 40 MG PO TABS: 40.0000 mg | ORAL_TABLET | Freq: Every day | ORAL | Status: AC

## 2013-12-26 MED ORDER — AZITHROMYCIN 250 MG PO TABS: ORAL_TABLET | ORAL | Status: AC

## 2013-12-26 NOTE — Assessment & Plan Note (Signed)
stable overall by history and exam, recent data reviewed with pt, and pt to continue medical treatment as before,  to f/u any worsening symptoms or concerns Lab Results  Component Value Date   WBC 8.1 11/04/2012   HGB 14.8 11/04/2012   HCT 44.0 11/04/2012   PLT 261.0 11/04/2012   GLUCOSE 99 11/04/2012   CHOL 195 11/04/2012   TRIG 161.0* 11/04/2012   HDL 46.20 11/04/2012   LDLDIRECT 150.9 11/08/2009   LDLCALC 117* 11/04/2012   ALT 15 11/04/2012   AST 21 11/04/2012   NA 139 11/04/2012   K 4.0 11/04/2012   CL 106 11/04/2012   CREATININE 0.7 11/04/2012   BUN 14 11/04/2012   CO2 27 11/04/2012   TSH 1.99 11/04/2012   INR 0.94 02/26/2010   HGBA1C 5.7 09/25/2011

## 2013-12-26 NOTE — Patient Instructions (Signed)
Please take all new medication as prescribed - the antibiotic, and the cough pills if needed  Please continue all other medications as before, and refills have been done if requested.  Please have the pharmacy call with any other refills you may need.  Please continue your efforts at being more active, low cholesterol diet, and weight control.  You are otherwise up to date with prevention measures today.  Please keep your appointments with your specialists as you may have planned  Please go to the XRAY Department in the Basement (go straight as you get off the elevator) for the x-ray testing  .You will be contacted by phone if any changes need to be made immediately.  Otherwise, you will receive a letter about your results with an explanation, but please check with MyChart first.  Please remember to sign up for MyChart if you have not done so, as this will be important to you in the future with finding out test results, communicating by private email, and scheduling acute appointments online when needed.  Please return in 6 months, or sooner if needed

## 2013-12-26 NOTE — Progress Notes (Signed)
Pre visit review using our clinic review tool, if applicable. No additional management support is needed unless otherwise documented below in the visit note. 

## 2013-12-26 NOTE — Assessment & Plan Note (Signed)
stable overall by history and exam, recent data reviewed with pt, and pt to continue medical treatment as before,  to f/u any worsening symptoms or concerns Lab Results  Component Value Date   HGBA1C 5.7 09/25/2011

## 2013-12-26 NOTE — Assessment & Plan Note (Signed)
stable overall by history and exam, recent data reviewed with pt, and pt to continue medical treatment as before,  to f/u any worsening symptoms or concerns BP Readings from Last 3 Encounters:  12/26/13 130/82  11/09/12 132/80  09/30/11 142/80

## 2013-12-26 NOTE — Assessment & Plan Note (Signed)
Etiology unclear, suspect underlying COPD with mild recent exacerbation prolonged, no wheezing today, for zpack, tess perle prn, and cxr - r/o mass

## 2013-12-26 NOTE — Assessment & Plan Note (Signed)

## 2013-12-26 NOTE — Progress Notes (Signed)
Subjective:    Patient ID: Brandi CarbonJane M Schaefer, female    DOB: 06/05/1930, 80 y.o.   MRN: [id number]  HPI    Here for wellness and f/u;  Overall doing ok;  Pt denies CP, worsening SOB, DOE, wheezing, orthopnea, PND, worsening LE edema, palpitations, dizziness or syncope.  Pt denies neurological change such as new headache, facial or extremity weakness.  Pt denies polydipsia, polyuria, or low sugar symptoms. Pt states overall good compliance with treatment and medications, good tolerability, and has been trying to follow lower cholesterol diet.  Pt denies worsening depressive symptoms, suicidal ideation or panic. No fever, night sweats, wt loss, loss of appetite, or other constitutional symptoms.  Pt states good ability with ADL's, has low fall risk, home safety reviewed and adequate, no other significant changes in hearing or vision, and only occasionally active with exercise.  Here with acute onset mild to mod 2 wks (since oct 21) severe ST, HA, general weakness and malaise, with prod cough greenish sputum with mild wheezing, mild sob, but Pt denies chest pain, increased sob or doe, orthopnea, PND, increased LE swelling, palpitations, dizziness or syncope.  Did see Dr Terri PiedraLupton every 6 mo with hx of skin ca - last visit oct 20.  Did also suffer injury with foot caught in the carpet June 1 with stumble but no fall, did see Dr Valera CastleKindle/ortho with dx severe muscle strain from buttocks to distal leg (per pt) and mult hip and related films; had f/u at 6 wks and advised to cont cane use, went back 3rd time seen per Dr Melrose NakayamaKindle and other assoc MD that day, tx with prednisone x 6 days low dose (10 tid) which did seem to help somewhat. Still limping today.  Was suggested to see Dr Ethelene Halamos for temp nerve block to left side but she declined, and was not offered surgury due to age and comorbids.  Denies worsening depressive symptoms, suicidal ideation, or panic; has ongoing anxiety Wt Readings from Last 3 Encounters:  12/26/13 121 lb  (54.885 kg)  11/09/12 125 lb 6 oz (56.87 kg)  09/30/11 125 lb 2 oz (56.756 kg)  Former smoker 1/2 ppd for > 40 yrs  Past Medical History  Diagnosis Date  . Impaired glucose tolerance 09/08/2010  . ACHILLES TENDINITIS 06/06/2008  . ALLERGIC RHINITIS 04/08/2007  . ANXIETY 10/15/2006  . Cough 08/11/2007  . DEPRESSION 10/15/2006  . HERNIATED DISC 10/15/2006  . HYPERLIPIDEMIA 04/08/2007  . HYPERTENSION 10/15/2006  . LOW BACK PAIN 10/15/2006  . OSTEOPENIA 04/08/2007  . Pain in soft tissues of limb 06/06/2008  . Preventative health care 09/08/2010  . RASH-NONVESICULAR 01/07/2009  . VAGINAL MASS 11/25/2009   Past Surgical History  Procedure Laterality Date  . Abdominal hysterectomy    . Cholecystectomy    . S/p squamous cell ca left arm    . S/p squamous cell right back       Dr. Terri PiedraLupton  . Bladder suspension    . Rectocele repair      reports that she has been smoking.  She does not have any smokeless tobacco history on file. She reports that she does not drink alcohol or use illicit drugs. family history includes Alcohol abuse in her other; COPD in her maternal grandfather; Cancer in her other; Dementia in her other; Hypertension in her other; Stroke in her other. Allergies  Allergen Reactions  . Cephalexin   . Clarithromycin   . Codeine   . Epinephrine   . Meloxicam Nausea And  Vomiting  . Pneumovax [Pneumococcal Polysaccharide Vaccine]   . Procaine Hcl   . Sulfonamide Derivatives    Current Outpatient Prescriptions on File Prior to Visit  Medication Sig Dispense Refill  . aspirin 81 MG tablet Take 81 mg by mouth daily.      . cetirizine (ZYRTEC) 10 MG tablet Take 1 tablet (10 mg total) by mouth daily. 90 tablet 3  . Ketoprofen-Lido-Gabapentin 5-2-5 % CREA Apply 1 application topically 2 (two) times daily as needed. 60 g 2   No current facility-administered medications on file prior to visit.   Review of Systems Constitutional: Negative for increased diaphoresis, other activity,  appetite or other siginficant weight change  HENT: Negative for worsening hearing loss, ear pain, facial swelling, mouth sores and neck stiffness.   Eyes: Negative for other worsening pain, redness or visual disturbance.  Respiratory: Negative for shortness of breath and wheezing.   Cardiovascular: Negative for chest pain and palpitations.  Gastrointestinal: Negative for diarrhea, blood in stool, abdominal distention or other pain Genitourinary: Negative for hematuria, flank pain or change in urine volume.  Musculoskeletal: Negative for myalgias or other joint complaints.  Skin: Negative for color change and wound.  Neurological: Negative for syncope and numbness. other than noted Hematological: Negative for adenopathy. or other swelling Psychiatric/Behavioral: Negative for hallucinations, self-injury, decreased concentration or other worsening agitation.      Objective:   Physical Exam BP 130/82 mmHg  Pulse 98  Temp(Src) 98.5 F (36.9 C) (Oral)  Ht 4\' 11"  (1.499 m)  Wt 121 lb (54.885 kg)  BMI 24.43 kg/m2  SpO2 95% VS noted,  Constitutional: Pt is oriented to person, place, and time. Appears well-developed and well-nourished.  Head: Normocephalic and atraumatic.  Right Ear: External ear normal.  Left Ear: External ear normal.  Nose: Nose normal.  Mouth/Throat: Oropharynx is clear and moist.  Eyes: Conjunctivae and EOM are normal. Pupils are equal, round, and reactive to light.  Neck: Normal range of motion. Neck supple. No JVD present. No tracheal deviation present.  Bilat tm's with mild erythema.  Max sinus areas non tender.  Pharynx with mild erythema, no exudate Cardiovascular: Normal rate, regular rhythm, normal heart sounds and intact distal pulses.   Pulmonary/Chest: Effort normal and breath sounds without rales, has decreased bilat BS Abdominal: Soft. Bowel sounds are normal. NT. No HSM  Musculoskeletal: Normal range of motion. Exhibits no edema.  Lymphadenopathy:  Has no  cervical adenopathy.  Neurological: Pt is alert and oriented to person, place, and time. Pt has normal reflexes. No cranial nerve deficit. Motor grossly intact Skin: Skin is warm and dry. No rash noted.  Psychiatric:  Has mild dysphoric/neurvous mood and affect. Behavior is normal.     Assessment & Plan:

## 2013-12-27 ENCOUNTER — Encounter: Payer: Self-pay | Admitting: Internal Medicine

## 2013-12-27 LAB — TSH: TSH: 1.95 u[IU]/mL (ref 0.35–4.50)

## 2014-01-01 ENCOUNTER — Other Ambulatory Visit: Payer: Self-pay | Admitting: Internal Medicine

## 2015-01-16 IMAGING — CR DG CHEST 2V
2 series · 2 of 2 positions shown · non-contrast
Comparison: 02/21/2009 and 08/11/2007 chest radiographs

CLINICAL DATA: 83-year-old female with nonproductive cough for 2
weeks.

EXAM:
CHEST  2 VIEW

[view not recorded (1 of 2)]
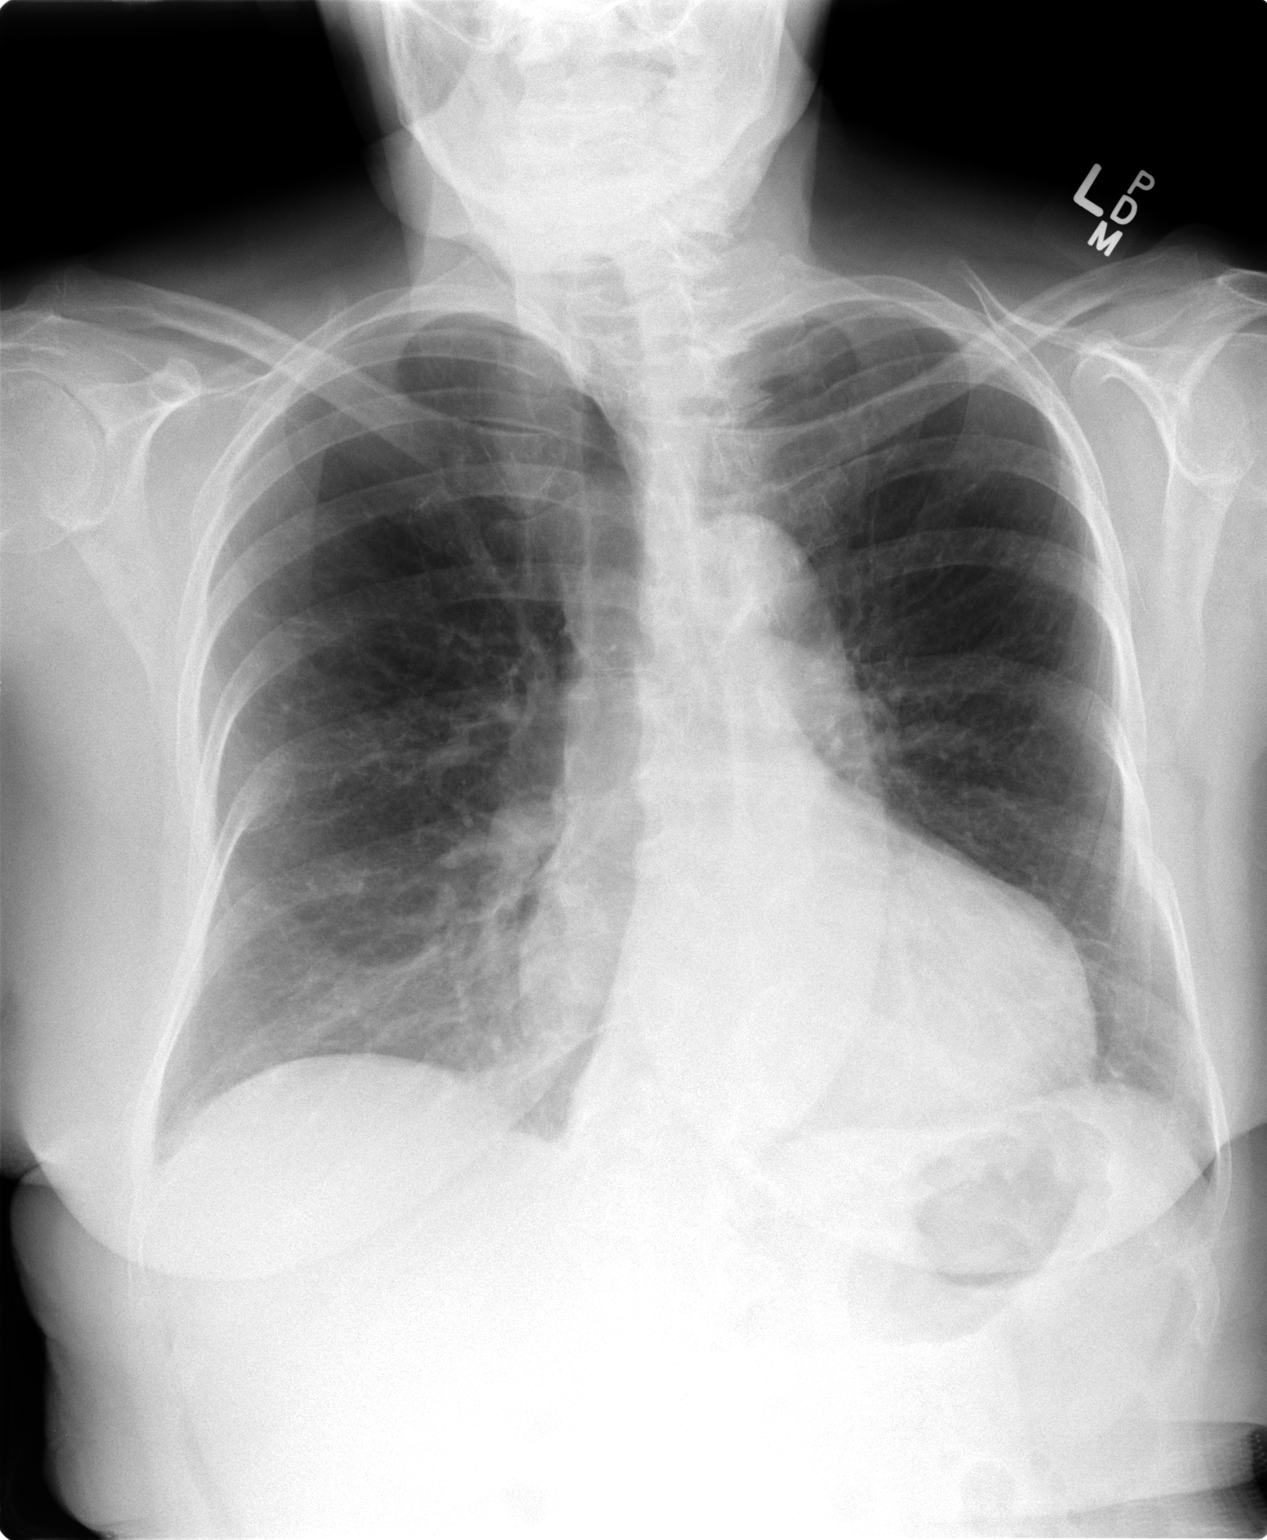

[view not recorded (2 of 2)]
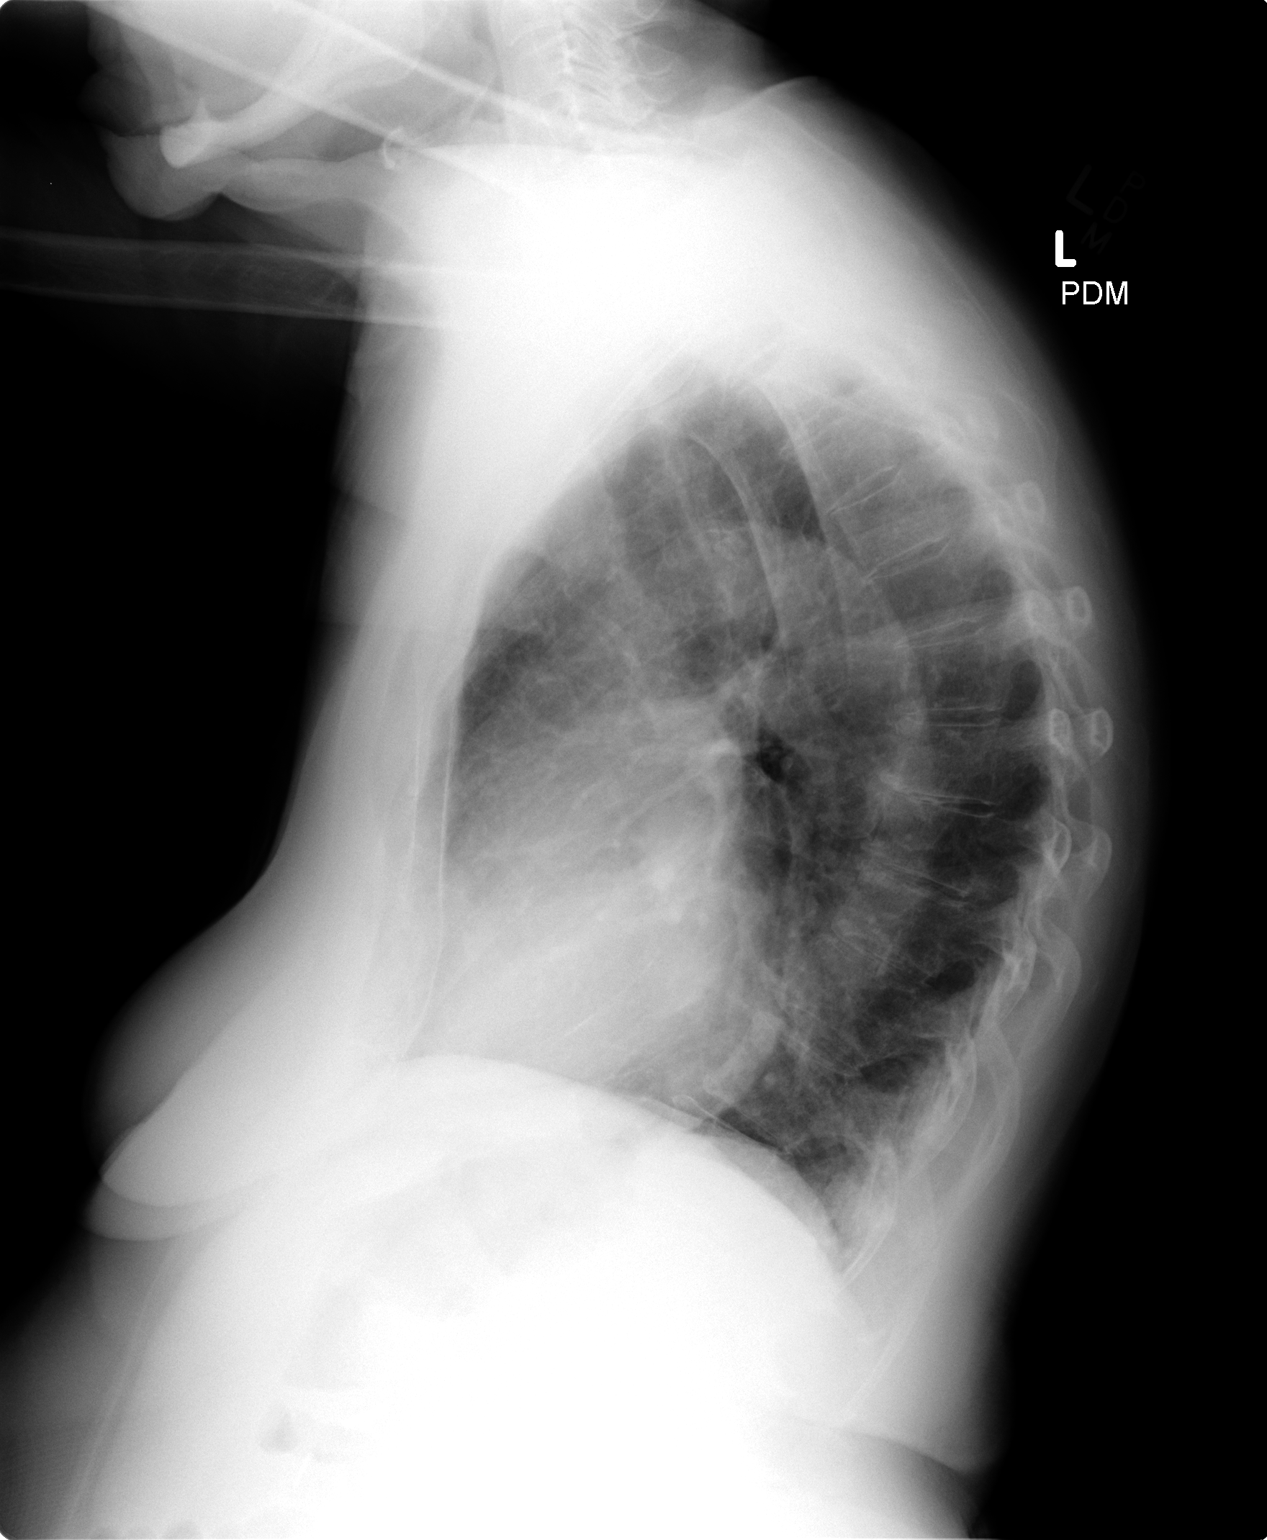

[2 of 2 positions shown; findings below may reference images not displayed]

FINDINGS: Mild cardiomegaly noted.

There is no evidence of focal airspace disease, pulmonary edema,
suspicious pulmonary nodule/mass, pleural effusion, or pneumothorax.
No acute bony abnormalities are identified.
IMPRESSION: Mild cardiomegaly without evidence of active cardiopulmonary
disease.
